# Patient Record
Sex: Female | Born: 1935 | Race: White | Hispanic: No | Smoking: Never smoker
Health system: Southern US, Community
[De-identification: ages and names within clinical notes are randomized; demographics above are authoritative.]

## PROBLEM LIST (undated history)

## (undated) DIAGNOSIS — I10 Essential (primary) hypertension: Secondary | ICD-10-CM

## (undated) DIAGNOSIS — C801 Malignant (primary) neoplasm, unspecified: Secondary | ICD-10-CM

## (undated) DIAGNOSIS — K219 Gastro-esophageal reflux disease without esophagitis: Secondary | ICD-10-CM

## (undated) DIAGNOSIS — M810 Age-related osteoporosis without current pathological fracture: Secondary | ICD-10-CM

## (undated) DIAGNOSIS — E785 Hyperlipidemia, unspecified: Secondary | ICD-10-CM

## (undated) HISTORY — DX: Malignant (primary) neoplasm, unspecified: C80.1

## (undated) HISTORY — DX: Essential (primary) hypertension: I10

## (undated) HISTORY — DX: Gastro-esophageal reflux disease without esophagitis: K21.9

## (undated) HISTORY — DX: Age-related osteoporosis without current pathological fracture: M81.0

## (undated) HISTORY — DX: Hyperlipidemia, unspecified: E78.5

---

## 1967-08-08 HISTORY — PX: ABDOMINAL HYSTERECTOMY: SHX81

## 1979-08-08 DIAGNOSIS — C801 Malignant (primary) neoplasm, unspecified: Secondary | ICD-10-CM

## 1979-08-08 HISTORY — DX: Malignant (primary) neoplasm, unspecified: C80.1

## 1979-08-08 HISTORY — PX: BREAST SURGERY: SHX581

## 1979-08-08 HISTORY — PX: MASTECTOMY: SHX3

## 2004-08-07 HISTORY — PX: TUMOR REMOVAL: SHX12

## 2004-12-08 ENCOUNTER — Ambulatory Visit: Payer: Self-pay | Admitting: Family Medicine

## 2004-12-09 ENCOUNTER — Encounter: Admission: RE | Admit: 2004-12-09 | Discharge: 2004-12-09 | Payer: Self-pay | Admitting: Orthopaedic Surgery

## 2005-03-29 ENCOUNTER — Ambulatory Visit: Payer: Self-pay | Admitting: Family Medicine

## 2005-12-27 ENCOUNTER — Ambulatory Visit: Payer: Self-pay | Admitting: Ophthalmology

## 2006-01-03 ENCOUNTER — Ambulatory Visit: Payer: Self-pay | Admitting: Ophthalmology

## 2006-02-16 ENCOUNTER — Ambulatory Visit: Payer: Self-pay | Admitting: Gastroenterology

## 2006-04-02 ENCOUNTER — Ambulatory Visit: Payer: Self-pay | Admitting: Family Medicine

## 2007-04-05 ENCOUNTER — Ambulatory Visit: Payer: Self-pay | Admitting: Family Medicine

## 2008-04-06 ENCOUNTER — Ambulatory Visit: Payer: Self-pay | Admitting: Family Medicine

## 2009-04-08 ENCOUNTER — Ambulatory Visit: Payer: Self-pay | Admitting: Family Medicine

## 2010-04-18 ENCOUNTER — Ambulatory Visit: Payer: Self-pay | Admitting: Family Medicine

## 2011-05-01 ENCOUNTER — Ambulatory Visit: Payer: Self-pay | Admitting: Family Medicine

## 2012-01-16 ENCOUNTER — Ambulatory Visit (INDEPENDENT_AMBULATORY_CARE_PROVIDER_SITE_OTHER): Payer: Medicare Other | Admitting: Internal Medicine

## 2012-01-16 ENCOUNTER — Encounter: Payer: Self-pay | Admitting: Internal Medicine

## 2012-01-16 VITALS — BP 156/64 | HR 57 | Temp 98.0°F | Resp 16 | Ht 64.0 in | Wt 151.2 lb

## 2012-01-16 DIAGNOSIS — E1121 Type 2 diabetes mellitus with diabetic nephropathy: Secondary | ICD-10-CM | POA: Insufficient documentation

## 2012-01-16 DIAGNOSIS — I10 Essential (primary) hypertension: Secondary | ICD-10-CM

## 2012-01-16 DIAGNOSIS — E039 Hypothyroidism, unspecified: Secondary | ICD-10-CM

## 2012-01-16 DIAGNOSIS — E119 Type 2 diabetes mellitus without complications: Secondary | ICD-10-CM

## 2012-01-16 DIAGNOSIS — E785 Hyperlipidemia, unspecified: Secondary | ICD-10-CM | POA: Insufficient documentation

## 2012-01-16 DIAGNOSIS — M7661 Achilles tendinitis, right leg: Secondary | ICD-10-CM

## 2012-01-16 DIAGNOSIS — M810 Age-related osteoporosis without current pathological fracture: Secondary | ICD-10-CM | POA: Insufficient documentation

## 2012-01-16 DIAGNOSIS — M7660 Achilles tendinitis, unspecified leg: Secondary | ICD-10-CM

## 2012-01-16 DIAGNOSIS — K219 Gastro-esophageal reflux disease without esophagitis: Secondary | ICD-10-CM | POA: Insufficient documentation

## 2012-01-16 MED ORDER — OMEPRAZOLE 20 MG PO CPDR: 20.0000 mg | DELAYED_RELEASE_CAPSULE | Freq: Every day | ORAL | Status: DC

## 2012-01-16 MED ORDER — IBANDRONATE SODIUM 150 MG PO TABS: 150.0000 mg | ORAL_TABLET | ORAL | Status: DC

## 2012-01-16 NOTE — Assessment & Plan Note (Signed)
She is due for semi annual tsh

## 2012-01-16 NOTE — Progress Notes (Addendum)
Patient ID: Pamela Hanna, female   DOB: [birth year], 78 y.o.   MRN: [id number]  Patient Active Problem List  Diagnoses  . Hypertension  . Hyperlipidemia  . Type II diabetes mellitus, well controlled  . Osteoporosis, post-menopausal  . GERD (gastroesophageal reflux disease)  . Achilles tendinitis of right lower extremity  . Unspecified hypothyroidism    Subjective:  CC:   Chief Complaint  Patient presents with  . New Patient    HPI:   Pamela Hanna a 78 y.o. female who presents as a new patient to establish rimary care. Her first cc  Is heel pain started in calf since Sunday after walking for longer period of time than usually done while visiting with daughter in [place]. No improvement thus far with increase in use of advil to 4 daily . No history of PAD.  No claudication symptoms.  Has history of nocturnal leg cramps treated with quinine tablets (available in a bottle marked Leg Cramps ith "quinine" written in  spanish)  at [facility]. 2nd issue is stomach upset.  has been bothering her today , had a second (uncharacteristic) slightly loose stool today after eating a new  cabbage dish twice in the last 24 hours. Sleeping fine,  Woken up at 3 am by her cat., but able to seep  A few more hours,  Lives alone. Does her own shopping, housecleaning and small odd jobs  But her  yardwork done by a paid Financial controller.  Has osteoporosis,  Has been on Boniva for close to 8 years .  3rd new issue is occurrence of dyspepsia occurring after eating.  Has occurred daily for the lst week or so.  No nausea or unintentional wt loss.  No dark stool, no hiccups.       Past Medical History  Diagnosis Date  . Breast Cancer 1981    ledt mastectomy  . Hyperlipidemia   . Diabetes mellitus   . GERD (gastroesophageal reflux disease)   . Osteoporosis, post-menopausal     managedwith bisphosphonates since 2005 with no change  . Hypertension     Past Surgical History  Procedure Date  . Abdominal hysterectomy 1969   menorrhagia  . Cesarean section   . Tumor removal 2006    thoracic  back , benign  . Breast surgery 1981    left radical mastectomy with implant         The following portions of the patient's history were reviewed and updated as appropriate: Allergies, current medications, and problem list.    Review of Systems:   12 Pt  review of systems was negative except those addressed in the HPI,     History   Social History  . Marital Status: Unknown    Spouse Name: N/A    Number of Children: N/A  . Years of Education: N/A   Occupational History  . Not on file.   Social History Main Topics  . Smoking status: Never Smoker   . Smokeless tobacco: Never Used  . Alcohol Use: No  . Drug Use: No  . Sexually Active: Not on file   Other Topics Concern  . Not on file   Social History Narrative  . No narrative on file    Objective:  BP 156/64  Pulse 57  Temp(Src) 98 F (36.7 C) (Oral)  Resp 16  Ht 5\' 4"  (1.626 m)  Wt 151 lb 4 oz (68.607 kg)  BMI 25.96 kg/m2  SpO2 97%  General appearance: alert, cooperative and appears  stated age Ears: normal TM's and external ear canals both ears Throat: lips, mucosa, and tongue normal; teeth and gums normal Neck: no adenopathy, no carotid bruit, supple, symmetrical, trachea midline and thyroid not enlarged, symmetric, no tenderness/mass/nodules Back: symmetric, no curvature. ROM normal. No CVA tenderness. Lungs: clear to auscultation bilaterally Heart: regular rate and rhythm, S1, S2 normal, no murmur, click, rub or gallop Abdomen: soft, non-tender; bowel sounds normal; no masses,  no organomegaly Pulses: 2+ and symmetric Skin: Skin color, texture, turgor normal. No rashes or lesions Lymph nodes: Cervical, supraclavicular, and axillary nodes normal. Neuro: grossly nonfocal except fro loss of DTRS from knee distally bilaterally/ Foot exam:  Nails are well trimmed,  No callouses,  Sensation intact to microfilament   Assessment  and Plan:  GERD (gastroesophageal reflux disease) New problem, precipitated by eating.  No dysphagia, weight loss,  Trial of omeprazole   Hypertension 'white coat hypertension' with higher in-office readings than home readings.  Will have her check it at [facility] and report readings to me prior to making chagnes.   Type II diabetes mellitus, well controlled Last A1c was 7.1 in [place].  Up to date with eye exam,.. Foot exam was normal today except for lack of reflexes.  Repeat labs due. No medication changes today.   Achilles tendinitis of right lower extremity Supportive care recommended. If no improvement with NSAIDs, ice/heat and stretching, orthopedics referral   Unspecified hypothyroidism She is due for semi annual tsh    Updated Medication List Outpatient Encounter Prescriptions as of 01/16/2012  Medication Sig Dispense Refill  . aspirin 81 MG tablet Take 81 mg by mouth daily.      . Calcium Carbonate-Vitamin D (CALCIUM + D) 600-200 MG-UNIT TABS Take by mouth.      . diltiazem (DILACOR XR) 240 MG 24 hr capsule Take 240 mg by mouth daily.      . fish oil-omega-3 fatty acids 1000 MG capsule Take 2 g by mouth daily.      Marland Kitchen glimepiride (AMARYL) 2 MG tablet Take 2 mg by mouth daily before breakfast.      . ibandronate (BONIVA) 150 MG tablet Take 1 tablet (150 mg total) by mouth every 30 (thirty) days. Take in the morning with a full glass of water, on an empty stomach, and do not take anything else by mouth or lie down for the next 30 min.  4 tablet  3  . levothyroxine (SYNTHROID, LEVOTHROID) 75 MCG tablet Take 75 mcg by mouth daily.      . metFORMIN (GLUCOPHAGE) 500 MG tablet Take 500 mg by mouth 2 (two) times daily with a meal.      . omeprazole (PRILOSEC) 20 MG capsule Take 1 capsule (20 mg total) by mouth daily.  30 capsule  3  . potassium chloride (K-DUR) 10 MEQ tablet Take 10 mEq by mouth daily.      . pravastatin (PRAVACHOL) 40 MG tablet Take 40 mg by mouth daily.      .  ranitidine (ZANTAC) 75 MG tablet Take 75 mg by mouth 2 (two) times daily.      . valsartan-hydrochlorothiazide (DIOVAN-HCT) 320-25 MG per tablet Take 1 tablet by mouth daily.      Marland Kitchen DISCONTD: ibandronate (BONIVA) 150 MG tablet Take 150 mg by mouth every 30 (thirty) days. Take in the morning with a full glass of water, on an empty stomach, and do not take anything else by mouth or lie down for the next 30 min.  Orders Placed This Encounter  Procedures  . [facility] MAMMOGRAPHY  . Microalbumin / creatinine urine ratio  . Comprehensive metabolic panel  . Lipid panel  . Hemoglobin A1c  . TSH    No Follow-up on file.

## 2012-01-16 NOTE — Progress Notes (Signed)
Addended by: Duncan Dull on: 01/16/2012 03:36 PM   Modules accepted: Orders

## 2012-01-16 NOTE — Assessment & Plan Note (Signed)
Last A1c was 7.1 in [place].  Up to date with eye exam,.. Foot exam was normal today except for lack of reflexes.  Repeat labs due. No medication changes today.

## 2012-01-16 NOTE — Assessment & Plan Note (Signed)
New problem, precipitated by eating.  No dysphagia, weight loss,  Trial of omeprazole

## 2012-01-16 NOTE — Assessment & Plan Note (Signed)
'  white coat hypertension' with higher in-office readings than home readings.  Will have her check it at [facility] and report readings to me prior to making chagnes.

## 2012-01-16 NOTE — Assessment & Plan Note (Signed)
Supportive care recommended. If no improvement with NSAIDs, ice/heat and stretching, orthopedics referral

## 2012-01-16 NOTE — Patient Instructions (Addendum)
You can take 2 or 3 advil  every 8 hours along with 1 tylenol every 8 hours for your achilles tendonitis  Ice pack , wrap around ankle 15 minutes  Per session,  2 or times daily .  Alternate with very warm water .  Try omeprazole (OTC prilosec) 20 mg daily to protect stomach upset and prevent reflux

## 2012-01-16 NOTE — Progress Notes (Signed)
  Patient ID: Pamela Hanna, female   DOB: 02-23-1925, 78 y.o.   MRN: [id number]  Heel pain started in calf since Sunday after walking a good but around [facility] with daughter the day prior . No improvement thus far. No history of PAD<  No claudication symptoms.  Nocturnal leg cramps treated with quinine tablets (available in spanish)  at [facility].  Stomach has been bothering her today , with a loose stool today after eating cabbage twice in 24 hours. Also has increased her advil to 4 daily since Sunday . Sleeping fine,  Woken up at 3 am by her cat., but able to seep  A few more hours,  Lives alone. Does her own shopping, housecleaning and small odd jobs  But her  yardwork done by a paid Financial controller.  Has osteoporosis,  Has been on Boniva for close to 5 years .  Gets reflux after eating

## 2012-01-23 ENCOUNTER — Other Ambulatory Visit (INDEPENDENT_AMBULATORY_CARE_PROVIDER_SITE_OTHER): Payer: Medicare Other | Admitting: *Deleted

## 2012-01-23 DIAGNOSIS — E039 Hypothyroidism, unspecified: Secondary | ICD-10-CM

## 2012-01-23 DIAGNOSIS — E119 Type 2 diabetes mellitus without complications: Secondary | ICD-10-CM

## 2012-01-23 LAB — TSH: TSH: 3.11 u[IU]/mL (ref 0.35–5.50)

## 2012-01-23 LAB — COMPREHENSIVE METABOLIC PANEL
Albumin: 4.2 g/dL (ref 3.5–5.2)
BUN: 29 mg/dL — ABNORMAL HIGH (ref 6–23)
CO2: 25 mEq/L (ref 19–32)
Chloride: 107 mEq/L (ref 96–112)
Creatinine, Ser: 1.3 mg/dL — ABNORMAL HIGH (ref 0.4–1.2)
Glucose, Bld: 139 mg/dL — ABNORMAL HIGH (ref 70–99)
Total Bilirubin: 0.7 mg/dL (ref 0.3–1.2)

## 2012-01-23 LAB — LIPID PANEL
Total CHOL/HDL Ratio: 5
Triglycerides: 185 mg/dL — ABNORMAL HIGH (ref 0.0–149.0)

## 2012-01-23 LAB — MICROALBUMIN / CREATININE URINE RATIO: Microalb Creat Ratio: 1.3 mg/g (ref 0.0–30.0)

## 2012-01-23 LAB — HEMOGLOBIN A1C: Hgb A1c MFr Bld: 7.3 % — ABNORMAL HIGH (ref 4.6–6.5)

## 2012-02-06 ENCOUNTER — Telehealth: Payer: Self-pay | Admitting: Internal Medicine

## 2012-02-06 MED ORDER — OMEPRAZOLE 20 MG PO CPDR: 20.0000 mg | DELAYED_RELEASE_CAPSULE | Freq: Every day | ORAL | Status: DC

## 2012-02-06 NOTE — Telephone Encounter (Signed)
Patient is wanting a 90 supply of Omeprazole 20 mg.

## 2012-02-06 NOTE — Telephone Encounter (Signed)
Yes, stop it .  chart updated

## 2012-02-06 NOTE — Telephone Encounter (Signed)
Patient called and stated you wanted to know a medication she is taking, its Boniva.  She stated she has been on it since 12/2003 and wanted to know if she needed to stop it.  Please advise.

## 2012-02-07 ENCOUNTER — Other Ambulatory Visit: Payer: Self-pay | Admitting: Internal Medicine

## 2012-02-07 DIAGNOSIS — R7989 Other specified abnormal findings of blood chemistry: Secondary | ICD-10-CM

## 2012-02-07 NOTE — Telephone Encounter (Signed)
Patient notified

## 2012-02-23 ENCOUNTER — Other Ambulatory Visit: Payer: Medicare Other

## 2012-02-23 ENCOUNTER — Other Ambulatory Visit (INDEPENDENT_AMBULATORY_CARE_PROVIDER_SITE_OTHER): Payer: Medicare Other | Admitting: *Deleted

## 2012-02-23 DIAGNOSIS — Z79899 Other long term (current) drug therapy: Secondary | ICD-10-CM

## 2012-02-23 LAB — HEPATIC FUNCTION PANEL
AST: 49 U/L — ABNORMAL HIGH (ref 0–37)
Alkaline Phosphatase: 61 U/L (ref 39–117)
Total Bilirubin: 0.7 mg/dL (ref 0.3–1.2)

## 2012-02-26 ENCOUNTER — Other Ambulatory Visit: Payer: Self-pay | Admitting: *Deleted

## 2012-02-26 MED ORDER — METFORMIN HCL 500 MG PO TABS: 500.0000 mg | ORAL_TABLET | Freq: Two times a day (BID) | ORAL | Status: DC

## 2012-03-20 ENCOUNTER — Telehealth: Payer: Self-pay | Admitting: Internal Medicine

## 2012-03-20 MED ORDER — POTASSIUM CHLORIDE ER 10 MEQ PO TBCR: 10.0000 meq | EXTENDED_RELEASE_TABLET | Freq: Every day | ORAL | Status: DC

## 2012-03-20 NOTE — Telephone Encounter (Signed)
Patient needs a refill called into [facility] pharmacy for her potassium 10 mg, she would like a 3 month supply called in.

## 2012-03-25 ENCOUNTER — Other Ambulatory Visit: Payer: Self-pay | Admitting: Internal Medicine

## 2012-03-25 MED ORDER — DILTIAZEM HCL ER 240 MG PO CP24: 240.0000 mg | ORAL_CAPSULE | Freq: Every day | ORAL | Status: DC

## 2012-03-25 NOTE — Telephone Encounter (Signed)
Refill on Diltiazem XR 240 mg 3 month supply.

## 2012-04-01 ENCOUNTER — Telehealth: Payer: Self-pay | Admitting: Internal Medicine

## 2012-04-01 MED ORDER — METFORMIN HCL 500 MG PO TABS: 500.0000 mg | ORAL_TABLET | Freq: Two times a day (BID) | ORAL | Status: DC

## 2012-04-01 NOTE — Telephone Encounter (Signed)
Patient called in would like her metformin called into [facility] pharmacy for a 3 month supply.  She does have enough for the rest of this week. I advised her to call pharmacy from now on and she said she would but since she was asking for a 3 month supply she thought she should call here.

## 2012-04-03 ENCOUNTER — Other Ambulatory Visit: Payer: Self-pay | Admitting: *Deleted

## 2012-04-03 MED ORDER — GLIMEPIRIDE 2 MG PO TABS: 2.0000 mg | ORAL_TABLET | Freq: Every day | ORAL | Status: DC

## 2012-04-03 MED ORDER — PRAVASTATIN SODIUM 40 MG PO TABS: 40.0000 mg | ORAL_TABLET | Freq: Every day | ORAL | Status: DC

## 2012-04-03 MED ORDER — LEVOTHYROXINE SODIUM 75 MCG PO TABS: 75.0000 ug | ORAL_TABLET | Freq: Every day | ORAL | Status: DC

## 2012-04-05 ENCOUNTER — Other Ambulatory Visit: Payer: Self-pay | Admitting: Internal Medicine

## 2012-04-05 MED ORDER — GLIMEPIRIDE 2 MG PO TABS: 2.0000 mg | ORAL_TABLET | Freq: Two times a day (BID) | ORAL | Status: DC

## 2012-04-24 ENCOUNTER — Other Ambulatory Visit: Payer: Self-pay | Admitting: Internal Medicine

## 2012-04-24 MED ORDER — DILTIAZEM HCL ER 240 MG PO CP24: 240.0000 mg | ORAL_CAPSULE | Freq: Every day | ORAL | Status: DC

## 2012-04-29 ENCOUNTER — Encounter: Payer: Self-pay | Admitting: Internal Medicine

## 2012-04-29 ENCOUNTER — Telehealth: Payer: Self-pay | Admitting: Internal Medicine

## 2012-04-29 ENCOUNTER — Ambulatory Visit (INDEPENDENT_AMBULATORY_CARE_PROVIDER_SITE_OTHER): Payer: Medicare Other | Admitting: Internal Medicine

## 2012-04-29 VITALS — BP 134/60 | HR 60 | Temp 97.9°F | Wt 152.8 lb

## 2012-04-29 DIAGNOSIS — K759 Inflammatory liver disease, unspecified: Secondary | ICD-10-CM

## 2012-04-29 DIAGNOSIS — E785 Hyperlipidemia, unspecified: Secondary | ICD-10-CM

## 2012-04-29 DIAGNOSIS — K219 Gastro-esophageal reflux disease without esophagitis: Secondary | ICD-10-CM

## 2012-04-29 DIAGNOSIS — Z853 Personal history of malignant neoplasm of breast: Secondary | ICD-10-CM | POA: Insufficient documentation

## 2012-04-29 DIAGNOSIS — R7989 Other specified abnormal findings of blood chemistry: Secondary | ICD-10-CM

## 2012-04-29 DIAGNOSIS — E119 Type 2 diabetes mellitus without complications: Secondary | ICD-10-CM

## 2012-04-29 DIAGNOSIS — Z1239 Encounter for other screening for malignant neoplasm of breast: Secondary | ICD-10-CM

## 2012-04-29 DIAGNOSIS — R748 Abnormal levels of other serum enzymes: Secondary | ICD-10-CM

## 2012-04-29 LAB — HEMOGLOBIN A1C: Hgb A1c MFr Bld: 7.4 % — ABNORMAL HIGH (ref 4.6–6.5)

## 2012-04-29 LAB — LDL CHOLESTEROL, DIRECT: Direct LDL: 159.6 mg/dL

## 2012-04-29 MED ORDER — VALSARTAN-HYDROCHLOROTHIAZIDE 320-25 MG PO TABS: 1.0000 | ORAL_TABLET | Freq: Every day | ORAL | Status: DC

## 2012-04-29 NOTE — Telephone Encounter (Signed)
Refilled at [facility] today

## 2012-04-29 NOTE — Telephone Encounter (Signed)
Valsartan/hctz 320-5 mg tab #90 Take one tablet by mouth every day

## 2012-04-29 NOTE — Progress Notes (Signed)
Patient ID: Pamela Hanna Hanna, female   DOB: 15-Jun-1925, 78 y.o.   MRN: [id number]  Patient Active Problem List  Diagnosis  . Hypertension  . Hyperlipidemia  . Type II diabetes mellitus, well controlled  . Osteoporosis, post-menopausal  . GERD (gastroesophageal reflux disease)  . Achilles tendinitis of right lower extremity  . Unspecified hypothyroidism  . History of breast cancer    Subjective:  CC:   Chief Complaint  Patient presents with  . Follow-up    Needs mammo referral    HPI:   Pamela Hanna Hanna a 78 y.o. female who presents for follow up on chronic conditions including diabetes mellitus, hyperlipidemia, hypertension and history of breast cancer. Pamela Hanna feels generally well.  Her blood sugars have been under 160 with rare exceptions.  Pamela Hanna denies any hypoglycemic events.  Pamela Hanna has had no changes in bowel habits, no trouble sleeping,  No numbness or parasthesias.  Her foot pain finally resolved with rest, NSAIDs and avoidance of barefoot.    Past Medical History  Diagnosis Date  . Breast Cancer 1981    ledt mastectomy  . Hyperlipidemia   . Diabetes mellitus   . GERD (gastroesophageal reflux disease)   . Osteoporosis, post-menopausal     managedwith bisphosphonates since 2005 with no change  . Hypertension     Past Surgical History  Procedure Date  . Abdominal hysterectomy 1969    menorrhagia  . Cesarean section   . Tumor removal 2006    thoracic  back , benign  . Breast surgery 1981    left radical mastectomy with implant         The following portions of the patient's history were reviewed and updated as appropriate: Allergies, current medications, and problem list.    Review of Systems:   12 Pt  review of systems was negative except those addressed in the HPI,     History   Social History  . Marital Status: Unknown    Spouse Name: N/A    Number of Children: N/A  . Years of Education: N/A   Occupational History  . Not on file.   Social History Main  Topics  . Smoking status: Never Smoker   . Smokeless tobacco: Never Used  . Alcohol Use: No  . Drug Use: No  . Sexually Active: Not on file   Other Topics Concern  . Not on file   Social History Narrative  . No narrative on file    Objective:  BP 134/60  Pulse 60  Temp 97.9 F (36.6 C) (Oral)  Wt 152 lb 12 oz (69.287 kg)  General appearance: alert, cooperative and appears stated age Ears: normal TM's and external ear canals both ears Throat: lips, mucosa, and tongue normal; teeth and gums normal Neck: no adenopathy, no carotid bruit, supple, symmetrical, trachea midline and thyroid not enlarged, symmetric, no tenderness/mass/nodules Back: symmetric, no curvature. ROM normal. No CVA tenderness. Lungs: clear to auscultation bilaterally Heart: regular rate and rhythm, S1, S2 normal, no murmur, click, rub or gallop Abdomen: soft, non-tender; bowel sounds normal; no masses,  no organomegaly Pulses: 2+ and symmetric Skin: Skin color, texture, turgor normal. No rashes or lesions Lymph nodes: Cervical, supraclavicular, and axillary nodes normal.  Assessment and Plan:  GERD (gastroesophageal reflux disease) Managed with prilosec.    History of breast cancer Pamela Hanna is due for diagnostic mammogram of right breast.  Type II diabetes mellitus, well controlled Mostly well controlled.  A1c due,  Diabetic foot exam done.  Pamela Hanna has  decreased sensation over several areas.   Hyperlipidemia Pravastatin started, for LDL goal of 70. Repeat lpids are due.    Updated Medication List Outpatient Encounter Prescriptions as of 04/29/2012  Medication Sig Dispense Refill  . aspirin 81 MG tablet Take 81 mg by mouth daily.      . Calcium Carbonate-Vitamin D (CALCIUM + D) 600-200 MG-UNIT TABS Take by mouth.      . diltiazem (DILACOR XR) 240 MG 24 hr capsule Take 1 capsule (240 mg total) by mouth daily.  90 capsule  3  . fish oil-omega-3 fatty acids 1000 MG capsule Take 2 g by mouth daily.      Marland Kitchen  glimepiride (AMARYL) 2 MG tablet Take 1 tablet (2 mg total) by mouth 2 (two) times daily.  180 tablet  1  . levothyroxine (SYNTHROID, LEVOTHROID) 75 MCG tablet Take 1 tablet (75 mcg total) by mouth daily.  90 tablet  2  . metFORMIN (GLUCOPHAGE) 500 MG tablet Take 1 tablet (500 mg total) by mouth 2 (two) times daily with a meal.  180 tablet  3  . omeprazole (PRILOSEC) 20 MG capsule Take 1 capsule (20 mg total) by mouth daily.  90 capsule  3  . potassium chloride (K-DUR) 10 MEQ tablet Take 1 tablet (10 mEq total) by mouth daily.  90 tablet  3  . pravastatin (PRAVACHOL) 40 MG tablet Take 1 tablet (40 mg total) by mouth daily.  90 tablet  1  . ranitidine (ZANTAC) 75 MG tablet Take 75 mg by mouth 2 (two) times daily.      . valsartan-hydrochlorothiazide (DIOVAN-HCT) 320-25 MG per tablet Take 1 tablet by mouth daily.  90 tablet  3  . DISCONTD: valsartan-hydrochlorothiazide (DIOVAN-HCT) 320-25 MG per tablet Take 1 tablet by mouth daily.         Orders Placed This Encounter  Procedures  . MM Digital Screening  . Comprehensive metabolic panel  . Hemoglobin A1c  . LDL cholesterol, direct    Return in about 3 months (around 07/29/2012).

## 2012-04-29 NOTE — Assessment & Plan Note (Signed)
Managed with prilosec  

## 2012-04-29 NOTE — Assessment & Plan Note (Signed)
Pravastatin started, for LDL goal of 70. Repeat lpids are due.

## 2012-04-29 NOTE — Patient Instructions (Addendum)
Ask about your pneumonia and flu shot .  It is ok to repeat the pneumonia vaccine one more time if it has been 5 years.   Ask about the TdaP vaccine,  It is needed to protect against pertussis which is on the rise again (it also has the  tetanus 10 yr booster)  It will cost you $110.00 ;  Ask your pharmacist about his price.   We are repeating your bloodwork today

## 2012-04-29 NOTE — Assessment & Plan Note (Signed)
Mostly well controlled.  A1c due,  Diabetic foot exam done.  She has decreased sensation over several areas.

## 2012-04-29 NOTE — Assessment & Plan Note (Addendum)
She is due for diagnostic mammogram of right breast.

## 2012-04-30 LAB — COMPREHENSIVE METABOLIC PANEL
ALT: 51 U/L — ABNORMAL HIGH (ref 0–35)
Albumin: 4.5 g/dL (ref 3.5–5.2)
BUN: 35 mg/dL — ABNORMAL HIGH (ref 6–23)
CO2: 22 mEq/L (ref 19–32)
Calcium: 10.8 mg/dL — ABNORMAL HIGH (ref 8.4–10.5)
GFR: 43.23 mL/min — ABNORMAL LOW (ref >60.00)
Sodium: 146 mEq/L — ABNORMAL HIGH (ref 135–145)
Total Protein: 7.8 g/dL (ref 6.0–8.3)

## 2012-04-30 NOTE — Addendum Note (Signed)
Addended by: Duncan Dull on: 04/30/2012 05:28 PM   Modules accepted: Orders

## 2012-05-01 ENCOUNTER — Other Ambulatory Visit: Payer: Self-pay | Admitting: Internal Medicine

## 2012-05-01 MED ORDER — TETANUS-DIPHTH-ACELL PERTUSSIS 5-2.5-18.5 LF-MCG/0.5 IM SUSP: 0.5000 mL | Freq: Once | INTRAMUSCULAR | Status: DC

## 2012-05-01 MED ORDER — PNEUMOCOCCAL VAC POLYVALENT 25 MCG/0.5ML IJ INJ: 0.5000 mL | INJECTION | Freq: Once | INTRAMUSCULAR | Status: DC

## 2012-05-02 ENCOUNTER — Ambulatory Visit: Payer: Self-pay | Admitting: Internal Medicine

## 2012-05-02 ENCOUNTER — Telehealth: Payer: Self-pay | Admitting: Internal Medicine

## 2012-05-02 DIAGNOSIS — R748 Abnormal levels of other serum enzymes: Secondary | ICD-10-CM

## 2012-05-02 NOTE — Telephone Encounter (Signed)
abd ultrasound showed some fatty changes of the liver and sludge within the gallbladder. I would like to make sure that her gallbladder is emptying correctly by ordering an emptying study (HIDA scan) .  If this is normal , we can continue to suspend the pravastatin for another 2 months and repeat her liver tests then. Please call back if she does not hear from [place] by Monday about this appointment

## 2012-05-02 NOTE — Telephone Encounter (Signed)
Patient notified and verbalizes understanding.

## 2012-05-06 ENCOUNTER — Encounter: Payer: Self-pay | Admitting: Internal Medicine

## 2012-05-07 ENCOUNTER — Telehealth: Payer: Self-pay | Admitting: Internal Medicine

## 2012-05-07 ENCOUNTER — Ambulatory Visit: Payer: Self-pay | Admitting: Internal Medicine

## 2012-05-07 DIAGNOSIS — E785 Hyperlipidemia, unspecified: Secondary | ICD-10-CM

## 2012-05-07 DIAGNOSIS — R7989 Other specified abnormal findings of blood chemistry: Secondary | ICD-10-CM

## 2012-05-07 NOTE — Telephone Encounter (Signed)
her gallbladder test was normal. I would like her to continue to suspend her cholesterol medication and return for fasting lipids and a cmet in 2 months.

## 2012-05-08 NOTE — Telephone Encounter (Signed)
Patient notified of lab results

## 2012-05-08 NOTE — Telephone Encounter (Signed)
Pt called checking on her gallbladder test

## 2012-05-14 ENCOUNTER — Telehealth: Payer: Self-pay | Admitting: Internal Medicine

## 2012-05-14 ENCOUNTER — Encounter: Payer: Self-pay | Admitting: Internal Medicine

## 2012-05-14 DIAGNOSIS — Z853 Personal history of malignant neoplasm of breast: Secondary | ICD-10-CM

## 2012-05-14 NOTE — Telephone Encounter (Signed)
Order entered into epic

## 2012-05-14 NOTE — Telephone Encounter (Signed)
Pt is scheduled tomorrow at [facility] for Uni Lateral Mammo but the order needs to say Uni Lateral Diagnostic Mammo. Pt has history of Breast Cancer.

## 2012-05-15 ENCOUNTER — Ambulatory Visit: Payer: Self-pay | Admitting: Internal Medicine

## 2012-05-27 ENCOUNTER — Encounter: Payer: Self-pay | Admitting: Internal Medicine

## 2012-06-06 ENCOUNTER — Encounter: Payer: Self-pay | Admitting: Internal Medicine

## 2012-06-24 ENCOUNTER — Other Ambulatory Visit (INDEPENDENT_AMBULATORY_CARE_PROVIDER_SITE_OTHER): Payer: Medicare Other

## 2012-06-24 DIAGNOSIS — R7989 Other specified abnormal findings of blood chemistry: Secondary | ICD-10-CM

## 2012-06-24 DIAGNOSIS — E785 Hyperlipidemia, unspecified: Secondary | ICD-10-CM

## 2012-06-24 DIAGNOSIS — K760 Fatty (change of) liver, not elsewhere classified: Secondary | ICD-10-CM

## 2012-06-24 LAB — COMPREHENSIVE METABOLIC PANEL
AST: 98 U/L — ABNORMAL HIGH (ref 0–37)
Albumin: 4.3 g/dL (ref 3.5–5.2)
Alkaline Phosphatase: 73 U/L (ref 39–117)
BUN: 25 mg/dL — ABNORMAL HIGH (ref 6–23)
Calcium: 9.8 mg/dL (ref 8.4–10.5)
Chloride: 105 mEq/L (ref 96–112)
Glucose, Bld: 154 mg/dL — ABNORMAL HIGH (ref 70–99)
Potassium: 4.5 mEq/L (ref 3.5–5.1)
Sodium: 139 mEq/L (ref 135–145)
Total Protein: 7.5 g/dL (ref 6.0–8.3)

## 2012-06-24 LAB — LIPID PANEL
Cholesterol: 337 mg/dL — ABNORMAL HIGH (ref 0–200)
HDL: 35.6 mg/dL — ABNORMAL LOW (ref >39.00)
Triglycerides: 234 mg/dL — ABNORMAL HIGH (ref 0.0–149.0)
VLDL: 46.8 mg/dL — ABNORMAL HIGH (ref 0.0–40.0)

## 2012-06-26 NOTE — Addendum Note (Signed)
Addended by: Sherlene Shams on: 06/26/2012 06:06 PM   Modules accepted: Orders

## 2012-07-17 ENCOUNTER — Ambulatory Visit: Payer: Medicare Other | Admitting: Internal Medicine

## 2012-08-27 ENCOUNTER — Encounter: Payer: Self-pay | Admitting: Internal Medicine

## 2012-08-27 ENCOUNTER — Ambulatory Visit (INDEPENDENT_AMBULATORY_CARE_PROVIDER_SITE_OTHER): Payer: Medicare Other | Admitting: Internal Medicine

## 2012-08-27 VITALS — BP 138/60 | HR 57 | Temp 97.7°F | Resp 16 | Wt 157.2 lb

## 2012-08-27 DIAGNOSIS — E119 Type 2 diabetes mellitus without complications: Secondary | ICD-10-CM

## 2012-08-27 DIAGNOSIS — K760 Fatty (change of) liver, not elsewhere classified: Secondary | ICD-10-CM

## 2012-08-27 DIAGNOSIS — K7689 Other specified diseases of liver: Secondary | ICD-10-CM

## 2012-08-27 DIAGNOSIS — E039 Hypothyroidism, unspecified: Secondary | ICD-10-CM

## 2012-08-27 DIAGNOSIS — M810 Age-related osteoporosis without current pathological fracture: Secondary | ICD-10-CM

## 2012-08-27 DIAGNOSIS — E785 Hyperlipidemia, unspecified: Secondary | ICD-10-CM

## 2012-08-27 DIAGNOSIS — I10 Essential (primary) hypertension: Secondary | ICD-10-CM

## 2012-08-27 LAB — LIPID PANEL
Cholesterol: 218 mg/dL — ABNORMAL HIGH (ref 0–200)
HDL: 35.3 mg/dL — ABNORMAL LOW (ref >39.00)
Total CHOL/HDL Ratio: 6
Triglycerides: 216 mg/dL — ABNORMAL HIGH (ref 0.0–149.0)

## 2012-08-27 LAB — HEPATIC FUNCTION PANEL
ALT: 67 U/L — ABNORMAL HIGH (ref 0–35)
AST: 77 U/L — ABNORMAL HIGH (ref 0–37)
Albumin: 4.4 g/dL (ref 3.5–5.2)

## 2012-08-27 LAB — LDL CHOLESTEROL, DIRECT: Direct LDL: 134.9 mg/dL

## 2012-08-27 LAB — HEMOGLOBIN A1C: Hgb A1c MFr Bld: 8 % — ABNORMAL HIGH (ref 4.6–6.5)

## 2012-08-27 NOTE — Patient Instructions (Addendum)
Thank you for filling out the survey  You are doing great1  I will call you with the lab results  Return for your annual Medicare exam in 3 months

## 2012-08-28 ENCOUNTER — Encounter: Payer: Self-pay | Admitting: Internal Medicine

## 2012-08-28 DIAGNOSIS — K760 Fatty (change of) liver, not elsewhere classified: Secondary | ICD-10-CM | POA: Insufficient documentation

## 2012-08-28 MED ORDER — SAXAGLIPTIN HCL 2.5 MG PO TABS: 2.5000 mg | ORAL_TABLET | Freq: Every day | ORAL | Status: DC

## 2012-08-28 MED ORDER — ATORVASTATIN CALCIUM 20 MG PO TABS: 40.0000 mg | ORAL_TABLET | Freq: Every day | ORAL | Status: DC

## 2012-08-28 NOTE — Assessment & Plan Note (Signed)
T scores stable, no prior fractures on bisphosphonate therapy

## 2012-08-28 NOTE — Progress Notes (Signed)
Patient ID: Pamela Hanna, female   DOB: [birth year], 79 y.o.   MRN: [id number]   Patient Active Problem List  Diagnosis  . Hypertension  . Hyperlipidemia  . Type II diabetes mellitus, well controlled  . Osteoporosis, post-menopausal  . GERD (gastroesophageal reflux disease)  . Unspecified hypothyroidism  . History of breast cancer    Subjective:  CC:   Chief Complaint  Patient presents with  . Follow-up    HPI:   Pamela Selfis a 79 y.o. female who presents 3 month follow up on diabetes , hypertension and hyperlipidemia.  She feels fine,  Has been taking her medications as directed, and walking regularly.  Just returned from a trip to [place]. No leg weakness, no low blood sugars.  Denies nausea, muscle pain. Tendonitis has resolved.   Past Medical History  Diagnosis Date  . Breast Cancer 1981    left mastectomy  . Diabetes mellitus   . GERD (gastroesophageal reflux disease)   . Osteoporosis, post-menopausal     managedwith bisphosphonates since 2005 with no change  . Hypertension   . Hyperlipidemia      Past Surgical History  Procedure Date  . Abdominal hysterectomy 1969    menorrhagia  . Cesarean section   . Tumor removal 2006    thoracic  back , benign  . Breast surgery 1981    left radical mastectomy with implant    The following portions of the patient's history were reviewed and updated as appropriate: Allergies, current medications, and problem list.    Review of Systems:   Patient denies headache, fevers, malaise, unintentional weight loss, skin rash, eye pain, sinus congestion and sinus pain, sore throat, dysphagia,  hemoptysis , cough, dyspnea, wheezing, chest pain, palpitations, orthopnea, edema, abdominal pain, nausea, melena, diarrhea, constipation, flank pain, dysuria, hematuria, urinary  Frequency, nocturia, numbness, tingling, seizures,  Focal weakness, Loss of consciousness,  Tremor, insomnia, depression, anxiety, and suicidal ideation.     History    Social History  . Marital Status: Unknown    Spouse Name: N/A    Number of Children: N/A  . Years of Education: N/A   Occupational History  . Not on file.   Social History Main Topics  . Smoking status: Never Smoker   . Smokeless tobacco: Never Used  . Alcohol Use: No  . Drug Use: No  . Sexually Active: Not on file   Other Topics Concern  . Not on file   Social History Narrative  . No narrative on file    Objective:  BP 138/60  Pulse 57  Temp 97.7 F (36.5 C) (Oral)  Resp 16  Wt 157 lb 4 oz (71.328 kg)  SpO2 97%  General appearance: alert, cooperative and appears stated age Ears: normal TM's and external ear canals both ears Throat: lips, mucosa, and tongue normal; teeth and gums normal Neck: no adenopathy, no carotid bruit, supple, symmetrical, trachea midline and thyroid not enlarged, symmetric, no tenderness/mass/nodules Back: symmetric, no curvature. ROM normal. No CVA tenderness. Lungs: clear to auscultation bilaterally Heart: regular rate and rhythm, S1, S2 normal, no murmur, click, rub or gallop Abdomen: soft, non-tender; bowel sounds normal; no masses,  no organomegaly Pulses: 2+ and symmetric Skin: Skin color, texture, turgor normal. No rashes or lesions Lymph nodes: Cervical, supraclavicular, and axillary nodes normal.  Assessment and Plan:  Type II diabetes mellitus, well controlled worsening control, hgba1c now 8.0.  However she is 79 yrs old and unlkiely to be a candidate for insulin. Will  start low dose onglyza.  She is on baby asa, ARB.and statin.   Her foot exam was normal today and she is up to date on eye exams.   Hypertension Well controlled on current regimen. Renal function stable, no changes today.  Hyperlipidemia Statin was resumed in November for LDL of 248 without treatment and liver enzymes remained elevated without statins.  U/s consistent with fatty liver.  LDL is  Improved but not at goal.  Will change to low dose lipitor with  next refill.   Osteoporosis, post-menopausal T scores stable, no prior fractures on bisphosphonate therapy   Fatty liver disease, nonalcoholic secondary to diabetes and hyperlipidemia.  Resuming more aggressive statin and diabetes therapy.    Updated Medication List Outpatient Encounter Prescriptions as of 08/27/2012  Medication Sig Dispense Refill  . aspirin 81 MG tablet Take 81 mg by mouth daily.      . Calcium Carbonate-Vitamin D (CALCIUM + D) 600-200 MG-UNIT TABS Take by mouth.      . diltiazem (DILACOR XR) 240 MG 24 hr capsule Take 1 capsule (240 mg total) by mouth daily.  90 capsule  3  . fish oil-omega-3 fatty acids 1000 MG capsule Take 2 g by mouth daily.      Marland Kitchen glimepiride (AMARYL) 2 MG tablet Take 1 tablet (2 mg total) by mouth 2 (two) times daily.  180 tablet  1  . levothyroxine (SYNTHROID, LEVOTHROID) 75 MCG tablet Take 1 tablet (75 mcg total) by mouth daily.  90 tablet  2  . metFORMIN (GLUCOPHAGE) 500 MG tablet Take 1 tablet (500 mg total) by mouth 2 (two) times daily with a meal.  180 tablet  3  . omeprazole (PRILOSEC) 20 MG capsule Take 1 capsule (20 mg total) by mouth daily.  90 capsule  3  . potassium chloride (K-DUR) 10 MEQ tablet Take 1 tablet (10 mEq total) by mouth daily.  90 tablet  3  . ranitidine (ZANTAC) 75 MG tablet Take 75 mg by mouth 2 (two) times daily.      . valsartan-hydrochlorothiazide (DIOVAN-HCT) 320-25 MG per tablet Take 1 tablet by mouth daily.  90 tablet  3  . [DISCONTINUED] pravastatin (PRAVACHOL) 40 MG tablet Take 1 tablet (40 mg total) by mouth daily.  90 tablet  1  . atorvastatin (LIPITOR) 20 MG tablet Take 2 tablets (40 mg total) by mouth daily.  90 tablet  3  . saxagliptin HCl (ONGLYZA) 2.5 MG TABS tablet Take 1 tablet (2.5 mg total) by mouth daily.  30 tablet  5  . [DISCONTINUED] pneumococcal 23 valent vaccine (PNEUMOVAX 23) 25 MCG/0.5ML injection Inject 0.5 mLs into the muscle once.  0.5 mL  0  . [DISCONTINUED] TDaP (BOOSTRIX) 5-2.5-18.5  LF-MCG/0.5 injection Inject 0.5 mLs into the muscle once.  0.5 mL  0

## 2012-08-28 NOTE — Assessment & Plan Note (Signed)
Well controlled on current regimen. Renal function stable, no changes today. 

## 2012-08-28 NOTE — Assessment & Plan Note (Addendum)
Statin was resumed in November for LDL of 248 without treatment and liver enzymes remained elevated without statins.  U/s consistent with fatty liver.  LDL is  Improved but not at goal.  Will change to low dose lipitor with next refill.

## 2012-08-28 NOTE — Addendum Note (Signed)
Addended by: Sherlene Shams on: 08/28/2012 07:58 PM   Modules accepted: Orders

## 2012-08-28 NOTE — Assessment & Plan Note (Signed)
secondary to diabetes and hyperlipidemia.  Resuming more aggressive statin and diabetes therapy.

## 2012-08-28 NOTE — Assessment & Plan Note (Addendum)
worsening control, hgba1c now 8.0.  However she is 79 yrs old and unlkiely to be a candidate for insulin. Will start low dose onglyza.  She is on baby asa, ARB.and statin.   Her foot exam was normal today and she is up to date on eye exams.

## 2012-08-29 ENCOUNTER — Telehealth: Payer: Self-pay | Admitting: General Practice

## 2012-08-29 DIAGNOSIS — E785 Hyperlipidemia, unspecified: Secondary | ICD-10-CM

## 2012-08-29 MED ORDER — ATORVASTATIN CALCIUM 20 MG PO TABS: 20.0000 mg | ORAL_TABLET | Freq: Every day | ORAL | Status: DC

## 2012-08-29 NOTE — Telephone Encounter (Signed)
i assume yuo are referring to lipitor.  It should be 20 mg one tablet daily  90 day supply. sorry

## 2012-08-29 NOTE — Telephone Encounter (Signed)
Pharmacy wants to know if we would like pt to be on a 40mg  tablet. And is this a 3 month supply?

## 2012-08-29 NOTE — Telephone Encounter (Signed)
Medication error corrected on chart and with pharmacy.

## 2012-09-16 ENCOUNTER — Other Ambulatory Visit: Payer: Self-pay | Admitting: *Deleted

## 2012-09-16 DIAGNOSIS — E119 Type 2 diabetes mellitus without complications: Secondary | ICD-10-CM

## 2012-09-16 MED ORDER — SAXAGLIPTIN HCL 2.5 MG PO TABS: 2.5000 mg | ORAL_TABLET | Freq: Every day | ORAL | Status: DC

## 2012-09-16 NOTE — Telephone Encounter (Signed)
Refill Request Note:  Pt wants to get a 90 day supply-- is that ok?

## 2012-09-16 NOTE — Telephone Encounter (Signed)
Med filled.  

## 2012-11-11 ENCOUNTER — Other Ambulatory Visit: Payer: Self-pay | Admitting: *Deleted

## 2012-11-11 MED ORDER — GLIMEPIRIDE 2 MG PO TABS: 2.0000 mg | ORAL_TABLET | Freq: Two times a day (BID) | ORAL | Status: DC

## 2012-11-11 NOTE — Telephone Encounter (Signed)
Med filled.  

## 2012-11-28 ENCOUNTER — Other Ambulatory Visit (INDEPENDENT_AMBULATORY_CARE_PROVIDER_SITE_OTHER): Payer: Medicare Other

## 2012-11-28 DIAGNOSIS — K7689 Other specified diseases of liver: Secondary | ICD-10-CM

## 2012-11-28 DIAGNOSIS — K760 Fatty (change of) liver, not elsewhere classified: Secondary | ICD-10-CM

## 2012-11-28 DIAGNOSIS — E119 Type 2 diabetes mellitus without complications: Secondary | ICD-10-CM

## 2012-11-28 DIAGNOSIS — E039 Hypothyroidism, unspecified: Secondary | ICD-10-CM

## 2012-11-28 DIAGNOSIS — E785 Hyperlipidemia, unspecified: Secondary | ICD-10-CM

## 2012-11-28 LAB — LIPID PANEL
HDL: 33.3 mg/dL — ABNORMAL LOW (ref >39.00)
Total CHOL/HDL Ratio: 6

## 2012-11-28 LAB — COMPREHENSIVE METABOLIC PANEL
ALT: 71 U/L — ABNORMAL HIGH (ref 0–35)
AST: 99 U/L — ABNORMAL HIGH (ref 0–37)
Calcium: 9.6 mg/dL (ref 8.4–10.5)
Chloride: 102 mEq/L (ref 96–112)
Creatinine, Ser: 1.2 mg/dL (ref 0.4–1.2)

## 2012-11-28 LAB — HEMOGLOBIN A1C: Hgb A1c MFr Bld: 7.8 % — ABNORMAL HIGH (ref 4.6–6.5)

## 2012-12-03 ENCOUNTER — Ambulatory Visit (INDEPENDENT_AMBULATORY_CARE_PROVIDER_SITE_OTHER): Payer: Medicare Other | Admitting: Internal Medicine

## 2012-12-03 ENCOUNTER — Encounter: Payer: Self-pay | Admitting: Internal Medicine

## 2012-12-03 VITALS — BP 158/72 | HR 63 | Temp 97.6°F | Resp 16 | Wt 158.0 lb

## 2012-12-03 DIAGNOSIS — I10 Essential (primary) hypertension: Secondary | ICD-10-CM

## 2012-12-03 DIAGNOSIS — E785 Hyperlipidemia, unspecified: Secondary | ICD-10-CM

## 2012-12-03 NOTE — Progress Notes (Signed)
Patient ID: Pamela Hanna, female   DOB: [birth year], 79 y.o.   MRN: [id number]   Patient Active Problem List   Diagnosis Date Noted  . Fatty liver disease, nonalcoholic 08/28/2012  . History of breast cancer 04/29/2012  . Hypertension 01/16/2012  . GERD (gastroesophageal reflux disease) 01/16/2012  . Unspecified hypothyroidism 01/16/2012  . Hyperlipidemia   . Type II or unspecified type diabetes mellitus without mention of complication, uncontrolled   . Osteoporosis, post-menopausal     Subjective:  CC:   Chief Complaint  Patient presents with  . Follow-up    3 month    HPI:   Pamela Hanna a 79 y.o. female who presents 3 month follow up on diabetes, hyperlipidemia and hypertension. She feels well and is tolerating her medications.  She did not bring a log of her blood sugars but has checked them  Daily in the mornings and they have been under 160.  No recent lows.  Limiting her starches to two or three servings daily  .  No muscle pain, shortness of breath, or chest pain.    Past Medical History  Diagnosis Date  . Breast Cancer 1981    ledt mastectomy  . Diabetes mellitus   . GERD (gastroesophageal reflux disease)   . Osteoporosis, post-menopausal     managedwith bisphosphonates since 2005 with no change  . Hypertension   . Hyperlipidemia     Past Surgical History  Procedure Laterality Date  . Abdominal hysterectomy  1969    menorrhagia  . Cesarean section    . Tumor removal  2006    thoracic  back , benign  . Breast surgery  1981    left radical mastectomy with implant       The following portions of the patient's history were reviewed and updated as appropriate: Allergies, current medications, and problem list.    Review of Systems:   Patient denies headache, fevers, malaise, unintentional weight loss, skin rash, eye pain, sinus congestion and sinus pain, sore throat, dysphagia,  hemoptysis , cough, dyspnea, wheezing, chest pain, palpitations, orthopnea, edema,  abdominal pain, nausea, melena, diarrhea, constipation, flank pain, dysuria, hematuria, urinary  Frequency, nocturia, numbness, tingling, seizures,  Focal weakness, Loss of consciousness,  Tremor, insomnia, depression, anxiety, and suicidal ideation.      History   Social History  . Marital Status: Unknown    Spouse Name: N/A    Number of Children: N/A  . Years of Education: N/A   Occupational History  . Not on file.   Social History Main Topics  . Smoking status: Never Smoker   . Smokeless tobacco: Never Used  . Alcohol Use: No  . Drug Use: No  . Sexually Active: Not on file   Other Topics Concern  . Not on file   Social History Narrative  . No narrative on file    Objective:  BP 158/72  Pulse 63  Temp(Src) 97.6 F (36.4 C) (Oral)  Resp 16  Wt 158 lb (71.668 kg)  BMI 27.11 kg/m2  SpO2 99%  General appearance: alert, cooperative and appears stated age Ears: normal TM's and external ear canals both ears Throat: lips, mucosa, and tongue normal; teeth and gums normal Neck: no adenopathy, no carotid bruit, supple, symmetrical, trachea midline and thyroid not enlarged, symmetric, no tenderness/mass/nodules Back: symmetric, no curvature. ROM normal. No CVA tenderness. Lungs: clear to auscultation bilaterally Heart: regular rate and rhythm, S1, S2 normal, no murmur, click, rub or gallop Abdomen: soft, non-tender; bowel sounds  normal; no masses,  no organomegaly Pulses: 2+ and symmetric Skin: Skin color, texture, turgor normal. No rashes or lesions Lymph nodes: Cervical, supraclavicular, and axillary nodes normal. Foot exam:  Nails are well trimmed,  No callouses,  Sensation intact to microfilament  Assessment and Plan:  Type II or unspecified type diabetes mellitus without mention of complication, uncontrolled A1c improved to 7.8.  Patient asked to submit logs of blood sugars so doses of meds can be adjusted.   Hyperlipidemia improrved with statin therapy, and  liver enzymes are stable.   Hypertension Elevated today.  Reviewed list of meds, patient is not taking OTC meds that could be causing,. It.  Have asked patient to recheck bp at home a minimum of 5 times over the next 4 weeks and call readings to office for adjustment of medications.     Updated Medication List Outpatient Encounter Prescriptions as of 12/03/2012  Medication Sig Dispense Refill  . aspirin 81 MG tablet Take 81 mg by mouth daily.      Marland Kitchen atorvastatin (LIPITOR) 20 MG tablet Take 1 tablet (20 mg total) by mouth daily.  90 tablet  3  . Calcium Carbonate-Vitamin D (CALCIUM + D) 600-200 MG-UNIT TABS Take by mouth.      . diltiazem (DILACOR XR) 240 MG 24 hr capsule Take 1 capsule (240 mg total) by mouth daily.  90 capsule  3  . fish oil-omega-3 fatty acids 1000 MG capsule Take 2 g by mouth daily.      Marland Kitchen glimepiride (AMARYL) 2 MG tablet Take 1 tablet (2 mg total) by mouth 2 (two) times daily.  180 tablet  1  . levothyroxine (SYNTHROID, LEVOTHROID) 75 MCG tablet Take 1 tablet (75 mcg total) by mouth daily.  90 tablet  2  . metFORMIN (GLUCOPHAGE) 500 MG tablet Take 1 tablet (500 mg total) by mouth 2 (two) times daily with a meal.  180 tablet  3  . omeprazole (PRILOSEC) 20 MG capsule Take 1 capsule (20 mg total) by mouth daily.  90 capsule  3  . potassium chloride (K-DUR) 10 MEQ tablet Take 1 tablet (10 mEq total) by mouth daily.  90 tablet  3  . saxagliptin HCl (ONGLYZA) 2.5 MG TABS tablet Take 1 tablet (2.5 mg total) by mouth daily.  90 tablet  1  . valsartan-hydrochlorothiazide (DIOVAN-HCT) 320-25 MG per tablet Take 1 tablet by mouth daily.  90 tablet  3  . ranitidine (ZANTAC) 75 MG tablet Take 75 mg by mouth 2 (two) times daily.       No facility-administered encounter medications on file as of 12/03/2012.     Orders Placed This Encounter  Procedures  . [facility] DIABETES EYE EXAM    No Follow-up on file.

## 2012-12-03 NOTE — Patient Instructions (Addendum)
Your cholesterol is greatly improved  Your diabetes control is a little bit better, but still needs improvement.    Please check your sugars twice a day for the next week.  Pick a different  Meal each day , check blood sugar  right before the meal  And 2 hours after the meal and keep a diary of what you ate for that meal.  Don't skip meals!!!     Your blood pressure is elevated .  Get your blood pressure checked 3 times over the next week (different days) and let me know the readings

## 2012-12-04 ENCOUNTER — Encounter: Payer: Self-pay | Admitting: Internal Medicine

## 2012-12-04 NOTE — Assessment & Plan Note (Signed)
improrved with statin therapy, and liver enzymes are stable.

## 2012-12-04 NOTE — Assessment & Plan Note (Signed)
Elevated today.  Reviewed list of meds, patient is not taking OTC meds that could be causing,. It.  Have asked patient to recheck bp at home a minimum of 5 times over the next 4 weeks and call readings to office for adjustment of medications.   

## 2012-12-04 NOTE — Assessment & Plan Note (Addendum)
A1c improved to 7.8.  Patient asked to check sugars twice daily and submit logs of blood sugars so doses of meds can be adjusted.  She is on asa, statin and ARB.  Foot exam normal.

## 2012-12-16 ENCOUNTER — Telehealth: Payer: Self-pay | Admitting: Internal Medicine

## 2012-12-16 ENCOUNTER — Other Ambulatory Visit: Payer: Self-pay | Admitting: Internal Medicine

## 2012-12-16 DIAGNOSIS — I10 Essential (primary) hypertension: Secondary | ICD-10-CM

## 2012-12-16 NOTE — Telephone Encounter (Signed)
Ok, if her readings are > 140/80 at pharmacy consistently,  We will make a medication change, but not unless.

## 2012-12-16 NOTE — Telephone Encounter (Signed)
I have reviewed her blood sugars and they are very good, but I am concerned about her blood sugar dropping too low before lunch (she recorded a 63) .  Please ask her to eat a little more protein with breakfast or have a mid morning protein snack (cheese, hard boiled egg, ect)  Blood pressures are running high at home.  The diltiazem is the problem.  It's slowing her heart rate down but not doing enough for her blood pressure and is limiting my options.  I would like to know if she was ever put on metoprolol or carvedilol in the past.

## 2012-12-16 NOTE — Assessment & Plan Note (Signed)
Home pressures high.  Will consider changing diltiazem to metoprolol or carvedilol if she has no history of intolerance

## 2012-12-16 NOTE — Telephone Encounter (Signed)
Left message for patient to call office on voice mail 

## 2012-12-16 NOTE — Telephone Encounter (Signed)
Patient notified as requested and stated she will call if BP more than 140/80

## 2012-12-16 NOTE — Telephone Encounter (Signed)
Patient returned call and was advised to add protein to diet as requested and advised of BP running high at home patient stated she is now having BP checked through pharmacy and feels their pressure readings are more reliable. Patient stated she would like to try and give diet and new BP management through pharmacy a try.  Patient has not tried carvedilol or metoproplol.

## 2013-02-14 ENCOUNTER — Other Ambulatory Visit: Payer: Self-pay | Admitting: *Deleted

## 2013-02-14 MED ORDER — LEVOTHYROXINE SODIUM 75 MCG PO TABS: 75.0000 ug | ORAL_TABLET | Freq: Every day | ORAL | Status: DC

## 2013-02-14 MED ORDER — OMEPRAZOLE 20 MG PO CPDR: 20.0000 mg | DELAYED_RELEASE_CAPSULE | Freq: Every day | ORAL | Status: DC

## 2013-03-04 ENCOUNTER — Encounter: Payer: Self-pay | Admitting: Internal Medicine

## 2013-03-04 ENCOUNTER — Ambulatory Visit (INDEPENDENT_AMBULATORY_CARE_PROVIDER_SITE_OTHER): Payer: Medicare Other | Admitting: Internal Medicine

## 2013-03-04 VITALS — BP 138/62 | HR 58 | Temp 98.3°F | Resp 14 | Wt 153.2 lb

## 2013-03-04 DIAGNOSIS — K7689 Other specified diseases of liver: Secondary | ICD-10-CM

## 2013-03-04 DIAGNOSIS — E785 Hyperlipidemia, unspecified: Secondary | ICD-10-CM

## 2013-03-04 DIAGNOSIS — I10 Essential (primary) hypertension: Secondary | ICD-10-CM

## 2013-03-04 DIAGNOSIS — K760 Fatty (change of) liver, not elsewhere classified: Secondary | ICD-10-CM

## 2013-03-04 DIAGNOSIS — IMO0001 Reserved for inherently not codable concepts without codable children: Secondary | ICD-10-CM

## 2013-03-04 LAB — LDL CHOLESTEROL, DIRECT: Direct LDL: 140.7 mg/dL

## 2013-03-04 LAB — COMPREHENSIVE METABOLIC PANEL
AST: 86 U/L — ABNORMAL HIGH (ref 0–37)
Albumin: 4.3 g/dL (ref 3.5–5.2)
Alkaline Phosphatase: 82 U/L (ref 39–117)
Potassium: 4.8 mEq/L (ref 3.5–5.1)
Sodium: 138 mEq/L (ref 135–145)
Total Protein: 7.4 g/dL (ref 6.0–8.3)

## 2013-03-04 LAB — LIPID PANEL
Total CHOL/HDL Ratio: 6
VLDL: 31.8 mg/dL (ref 0.0–40.0)

## 2013-03-04 LAB — MICROALBUMIN / CREATININE URINE RATIO: Creatinine,U: 259.2 mg/dL

## 2013-03-04 NOTE — Assessment & Plan Note (Signed)
Well controlled on current regimen. Renal function stable, no changes today. 

## 2013-03-04 NOTE — Assessment & Plan Note (Signed)
LDL is improved from 248 to 127 on statin therapy.  Hepatic function is stable.

## 2013-03-04 NOTE — Patient Instructions (Addendum)
You are doing great!!  We will contact you with the results of your labs.   Try layering pecans,  Blueberry greek yogurt ([employer]) and whipped cream in a parfait galss for a yummy treat  instead of daily ice cream

## 2013-03-04 NOTE — Progress Notes (Addendum)
Patient ID: Pamela Hanna, female   DOB: Oct 15, 1924, 79 y.o.   MRN: [id number]   Patient Active Problem List   Diagnosis Date Noted  . Fatty liver disease, nonalcoholic 08/28/2012  . History of breast cancer 04/29/2012  . Hypertension 01/16/2012  . GERD (gastroesophageal reflux disease) 01/16/2012  . Unspecified hypothyroidism 01/16/2012  . Hyperlipidemia   . Type II or unspecified type diabetes mellitus without mention of complication, uncontrolled   . Osteoporosis, post-menopausal     Subjective:  CC:   Chief Complaint  Patient presents with  . Follow-up    3 month    HPI:   Pamela Selfis a 79 y.o. female who presents for 3 month follow up on Type 2 DM.  She has lost 5 lbs by cutting out sugars since April.   Specifically cake and cookies.  She is not walking every day butis  staying active woekin in her yard and in the house . No tick bites .  She is tolerating medications without hypoglycemic events muscle pains.  She is not walking barefoot anywhere.   Checks BS QOD and her fasting BS have been ranging from 90 to 124.    Past Medical History  Diagnosis Date  . Breast Cancer 1981    ledt mastectomy  . Diabetes mellitus   . GERD (gastroesophageal reflux disease)   . Osteoporosis, post-menopausal     managedwith bisphosphonates since 2005 with no change  . Hypertension   . Hyperlipidemia     Past Surgical History  Procedure Laterality Date  . Abdominal hysterectomy  1969    menorrhagia  . Cesarean section    . Tumor removal  2006    thoracic  back , benign  . Breast surgery  1981    left radical mastectomy with implant       The following portions of the patient's history were reviewed and updated as appropriate: Allergies, current medications, and problem list.    Review of Systems:   12 Pt  review of systems was negative except those addressed in the HPI,     History   Social History  . Marital Status: Unknown    Spouse Name: N/A    Number of  Children: N/A  . Years of Education: N/A   Occupational History  . Not on file.   Social History Main Topics  . Smoking status: Never Smoker   . Smokeless tobacco: Never Used  . Alcohol Use: No  . Drug Use: No  . Sexually Active: Not on file   Other Topics Concern  . Not on file   Social History Narrative  . No narrative on file    Objective:  BP 138/62  Pulse 58  Temp(Src) 98.3 F (36.8 C) (Oral)  Resp 14  Wt 153 lb 4 oz (69.514 kg)  BMI 26.29 kg/m2  SpO2 98%  General appearance: alert, cooperative and appears stated age Ears: normal TM's and external ear canals both ears Throat: lips, mucosa, and tongue normal; teeth and gums normal Neck: no adenopathy, no carotid bruit, supple, symmetrical, trachea midline and thyroid not enlarged, symmetric, no tenderness/mass/nodules Back: symmetric, no curvature. ROM normal. No CVA tenderness. Lungs: clear to auscultation bilaterally Heart: regular rate and rhythm, S1, S2 normal, no murmur, click, rub or gallop Abdomen: soft, non-tender; bowel sounds normal; no masses,  no organomegaly Pulses: 2+ and symmetric Skin: Skin color, texture, turgor normal. No rashes or lesions Lymph nodes: Cervical, supraclavicular, and axillary nodes normal.  Assessment and Plan:  Type II or unspecified type diabetes mellitus without mention of complication, uncontrolled A1c improved to 7.5.   She is on asa, statin and ARB.  Foot exam normal. No changes today.    Hyperlipidemia LDL is improved from 248 to 127 on statin therapy.  Hepatic function is stable.   Hypertension Well controlled on current regimen. Renal function stable, no changes today.  Fatty liver disease, nonalcoholic Hepatic enzymes are improved with management of DN and hyperlipidemia.    Updated Medication List Outpatient Encounter Prescriptions as of 03/04/2013  Medication Sig Dispense Refill  . aspirin 81 MG tablet Take 81 mg by mouth daily.      Marland Kitchen atorvastatin  (LIPITOR) 20 MG tablet Take 1 tablet (20 mg total) by mouth daily.  90 tablet  3  . Calcium Carbonate-Vitamin D (CALCIUM + D) 600-200 MG-UNIT TABS Take by mouth.      . diltiazem (DILACOR XR) 240 MG 24 hr capsule Take 1 capsule (240 mg total) by mouth daily.  90 capsule  3  . fish oil-omega-3 fatty acids 1000 MG capsule Take 2 g by mouth daily.      Marland Kitchen glimepiride (AMARYL) 2 MG tablet Take 1 tablet (2 mg total) by mouth 2 (two) times daily.  180 tablet  1  . levothyroxine (SYNTHROID, LEVOTHROID) 75 MCG tablet Take 1 tablet (75 mcg total) by mouth daily.  90 tablet  2  . metFORMIN (GLUCOPHAGE) 500 MG tablet Take 1 tablet (500 mg total) by mouth 2 (two) times daily with a meal.  180 tablet  3  . omeprazole (PRILOSEC) 20 MG capsule Take 1 capsule (20 mg total) by mouth daily.  90 capsule  3  . potassium chloride (K-DUR) 10 MEQ tablet Take 1 tablet (10 mEq total) by mouth daily.  90 tablet  3  . ranitidine (ZANTAC) 75 MG tablet Take 75 mg by mouth 2 (two) times daily.      . saxagliptin HCl (ONGLYZA) 2.5 MG TABS tablet Take 1 tablet (2.5 mg total) by mouth daily.  90 tablet  1  . valsartan-hydrochlorothiazide (DIOVAN-HCT) 320-25 MG per tablet Take 1 tablet by mouth daily.  90 tablet  3   No facility-administered encounter medications on file as of 03/04/2013.     Orders Placed This Encounter  Procedures  . Lipid panel  . Hemoglobin A1c  . Comprehensive metabolic panel  . Microalbumin / creatinine urine ratio  . LDL cholesterol, direct    Return in about 3 months (around 06/04/2013).

## 2013-03-04 NOTE — Assessment & Plan Note (Signed)
A1c improved to 7.5.   She is on asa, statin and ARB.  Foot exam normal. No changes today.

## 2013-03-04 NOTE — Assessment & Plan Note (Signed)
Hepatic enzymes are improved with management of DN and hyperlipidemia.

## 2013-03-05 ENCOUNTER — Encounter: Payer: Self-pay | Admitting: *Deleted

## 2013-03-17 ENCOUNTER — Other Ambulatory Visit: Payer: Self-pay | Admitting: *Deleted

## 2013-03-17 DIAGNOSIS — E119 Type 2 diabetes mellitus without complications: Secondary | ICD-10-CM

## 2013-03-17 MED ORDER — SAXAGLIPTIN HCL 2.5 MG PO TABS: 2.5000 mg | ORAL_TABLET | Freq: Every day | ORAL | Status: DC

## 2013-03-24 ENCOUNTER — Other Ambulatory Visit: Payer: Self-pay | Admitting: *Deleted

## 2013-03-24 MED ORDER — METFORMIN HCL 500 MG PO TABS: 500.0000 mg | ORAL_TABLET | Freq: Two times a day (BID) | ORAL | Status: DC

## 2013-04-21 ENCOUNTER — Other Ambulatory Visit: Payer: Self-pay | Admitting: *Deleted

## 2013-04-21 MED ORDER — LEVOTHYROXINE SODIUM 75 MCG PO TABS: 75.0000 ug | ORAL_TABLET | Freq: Every day | ORAL | Status: DC

## 2013-04-21 MED ORDER — DILTIAZEM HCL ER 240 MG PO CP24: 240.0000 mg | ORAL_CAPSULE | Freq: Every day | ORAL | Status: DC

## 2013-04-21 MED ORDER — VALSARTAN-HYDROCHLOROTHIAZIDE 320-25 MG PO TABS: 1.0000 | ORAL_TABLET | Freq: Every day | ORAL | Status: DC

## 2013-04-21 NOTE — Telephone Encounter (Signed)
Eprescribed.

## 2013-04-22 ENCOUNTER — Telehealth: Payer: Self-pay | Admitting: *Deleted

## 2013-04-22 NOTE — Telephone Encounter (Signed)
Refill Request  Contour Next Strips   Test blood glucose level once daily

## 2013-04-28 ENCOUNTER — Telehealth: Payer: Self-pay | Admitting: *Deleted

## 2013-04-28 MED ORDER — GLUCOSE BLOOD VI STRP: ORAL_STRIP | Status: DC

## 2013-04-28 NOTE — Telephone Encounter (Signed)
Refill Request  Contour Next Strips   Test Blood Glucose level once daily

## 2013-04-29 DIAGNOSIS — Z0279 Encounter for issue of other medical certificate: Secondary | ICD-10-CM

## 2013-05-14 ENCOUNTER — Other Ambulatory Visit: Payer: Self-pay | Admitting: *Deleted

## 2013-05-14 MED ORDER — GLIMEPIRIDE 2 MG PO TABS: 2.0000 mg | ORAL_TABLET | Freq: Two times a day (BID) | ORAL | Status: DC

## 2013-05-16 ENCOUNTER — Ambulatory Visit: Payer: Self-pay | Admitting: Internal Medicine

## 2013-06-03 ENCOUNTER — Encounter: Payer: Self-pay | Admitting: Internal Medicine

## 2013-06-10 ENCOUNTER — Encounter (INDEPENDENT_AMBULATORY_CARE_PROVIDER_SITE_OTHER): Payer: Self-pay

## 2013-06-10 ENCOUNTER — Ambulatory Visit (INDEPENDENT_AMBULATORY_CARE_PROVIDER_SITE_OTHER): Payer: Medicare Other | Admitting: Internal Medicine

## 2013-06-10 ENCOUNTER — Encounter: Payer: Self-pay | Admitting: Internal Medicine

## 2013-06-10 VITALS — BP 190/78 | HR 69 | Temp 98.5°F | Resp 14 | Wt 153.2 lb

## 2013-06-10 DIAGNOSIS — E039 Hypothyroidism, unspecified: Secondary | ICD-10-CM

## 2013-06-10 DIAGNOSIS — E785 Hyperlipidemia, unspecified: Secondary | ICD-10-CM

## 2013-06-10 DIAGNOSIS — I10 Essential (primary) hypertension: Secondary | ICD-10-CM

## 2013-06-10 LAB — LIPID PANEL
Cholesterol: 218 mg/dL — ABNORMAL HIGH (ref 0–200)
HDL: 39.8 mg/dL (ref >39.00)
Total CHOL/HDL Ratio: 5
Triglycerides: 174 mg/dL — ABNORMAL HIGH (ref 0.0–149.0)
VLDL: 34.8 mg/dL (ref 0.0–40.0)

## 2013-06-10 LAB — COMPREHENSIVE METABOLIC PANEL
AST: 62 U/L — ABNORMAL HIGH (ref 0–37)
Alkaline Phosphatase: 102 U/L (ref 39–117)
BUN: 23 mg/dL (ref 6–23)
Calcium: 9.9 mg/dL (ref 8.4–10.5)
Chloride: 104 mEq/L (ref 96–112)
Creatinine, Ser: 1.1 mg/dL (ref 0.4–1.2)
Total Bilirubin: 0.7 mg/dL (ref 0.3–1.2)

## 2013-06-10 LAB — HEMOGLOBIN A1C: Hgb A1c MFr Bld: 7.7 % — ABNORMAL HIGH (ref 4.6–6.5)

## 2013-06-10 NOTE — Assessment & Plan Note (Signed)
Elevated today because she did not take her medications.  Return on Friday after taking meds,  Bring home BP meter .

## 2013-06-10 NOTE — Assessment & Plan Note (Signed)
Thyroid function was WNL on current dose.  No current changes unless TSH is not WNL today

## 2013-06-10 NOTE — Assessment & Plan Note (Signed)
Tolerating atorvastatin,.  Fasting lipids due

## 2013-06-10 NOTE — Assessment & Plan Note (Signed)
Well-controlled on current medications.  hemoglobin A1c has been consistently less than 8.0 . sHe is up-to-date on eye exams and his foot exam is normal.  No protienuria by last check .  She is on the appropriate medications.

## 2013-06-10 NOTE — Patient Instructions (Addendum)
We are trying to get your A1c < 7.0 without adding more medications  You can try the [employer] crackers instead of the [facility];   they are lower in carbohydrates  Eat cheese or peanuts with your apple snack   Your blood pressure is too high Return on Friday with your blood pressure machine .  Make sure you have taken your medication for blood pressure that morning

## 2013-06-10 NOTE — Progress Notes (Signed)
Patient ID: Pamela Hanna, female   DOB: 1925/07/10, 79 y.o.   MRN: [id number]  Patient Active Problem List   Diagnosis Date Noted  . Fatty liver disease, nonalcoholic 08/28/2012  . History of breast cancer 04/29/2012  . Hypertension 01/16/2012  . GERD (gastroesophageal reflux disease) 01/16/2012  . Unspecified hypothyroidism 01/16/2012  . Hyperlipidemia   . Type II or unspecified type diabetes mellitus without mention of complication, uncontrolled   . Osteoporosis, post-menopausal     Subjective:  CC:   Chief Complaint  Patient presents with  . Follow-up    HPI:   Pamela Selfis a 79 y.o. female who presents Follow up on diabetes mellitus, hypertension, hypothyroidisn  and hyperlipidemia.  Taking medications as directed but did not take any medications this morning .  No low blood sugars.,  Eats an occasional chocolate bar and notes that it raises her blood sugar.  Diet reviewed,  Eats a lot of apples ,  Peanut butter on crackers.  Doesn't realize that crackers are high in carbohydrates.  No recent falls,  Walks regularly/daily. Very active ,  Went to the [employer] this year.     Past Medical History  Diagnosis Date  . Breast Cancer 1981    ledt mastectomy  . Diabetes mellitus   . GERD (gastroesophageal reflux disease)   . Osteoporosis, post-menopausal     managedwith bisphosphonates since 2005 with no change  . Hypertension   . Hyperlipidemia     Past Surgical History  Procedure Laterality Date  . Abdominal hysterectomy  1969    menorrhagia  . Cesarean section    . Tumor removal  2006    thoracic  back , benign  . Breast surgery  1981    left radical mastectomy with implant       The following portions of the patient's history were reviewed and updated as appropriate: Allergies, current medications, and problem list.    Review of Systems:   12 Pt  review of systems was negative except those addressed in the HPI,     History   Social History  . Marital  Status: Unknown    Spouse Name: N/A    Number of Children: N/A  . Years of Education: N/A   Occupational History  . Not on file.   Social History Main Topics  . Smoking status: Never Smoker   . Smokeless tobacco: Never Used  . Alcohol Use: No  . Drug Use: No  . Sexual Activity: Not on file   Other Topics Concern  . Not on file   Social History Narrative  . No narrative on file    Objective:  Filed Vitals:   06/10/13 0811  BP: 190/78  Pulse:   Temp:   Resp:      General appearance: alert, cooperative and appears stated age Ears: normal TM's and external ear canals both ears Throat: lips, mucosa, and tongue normal; teeth and gums normal Neck: no adenopathy, no carotid bruit, supple, symmetrical, trachea midline and thyroid not enlarged, symmetric, no tenderness/mass/nodules Back: symmetric, no curvature. ROM normal. No CVA tenderness. Lungs: clear to auscultation bilaterally Heart: regular rate and rhythm, S1, S2 normal, no murmur, click, rub or gallop Abdomen: soft, non-tender; bowel sounds normal; no masses,  no organomegaly Pulses: 2+ and symmetric Skin: Skin color, texture, turgor normal. No rashes or lesions Lymph nodes: Cervical, supraclavicular, and axillary nodes normal. Foot exam:  Nails are well trimmed,  No callouses,  Sensation intact to microfilament  Assessment  and Plan:  Hyperlipidemia Tolerating atorvastatin,.  Fasting lipids due   Hypertension Elevated today because she did not take her medications.  Return on Friday after taking meds,  Bring home BP meter .   Type II or unspecified type diabetes mellitus without mention of complication, uncontrolled Well-controlled on current medications.  hemoglobin A1c has been consistently less than 8.0 . sHe is up-to-date on eye exams and his foot exam is normal.  No proteinuria by last check .  She is on the appropriate medications. Lab Results  Component Value Date   HGBA1C 7.7* 06/10/2013     Unspecified hypothyroidism Thyroid function was WNL on current dose.  No current changes unless TSH is not WNL today   Updated Medication List Outpatient Encounter Prescriptions as of 06/10/2013  Medication Sig  . aspirin 81 MG tablet Take 81 mg by mouth daily.  Marland Kitchen atorvastatin (LIPITOR) 20 MG tablet Take 1 tablet (20 mg total) by mouth daily.  . Calcium Carbonate-Vitamin D (CALCIUM + D) 600-200 MG-UNIT TABS Take by mouth.  . diltiazem (DILACOR XR) 240 MG 24 hr capsule Take 1 capsule (240 mg total) by mouth daily.  . fish oil-omega-3 fatty acids 1000 MG capsule Take 2 g by mouth daily.  Marland Kitchen glimepiride (AMARYL) 2 MG tablet Take 1 tablet (2 mg total) by mouth 2 (two) times daily.  Marland Kitchen glucose blood (BAYER CONTOUR NEXT TEST) test strip Use as instructed  . levothyroxine (SYNTHROID, LEVOTHROID) 75 MCG tablet Take 1 tablet (75 mcg total) by mouth daily.  . metFORMIN (GLUCOPHAGE) 500 MG tablet Take 1 tablet (500 mg total) by mouth 2 (two) times daily with a meal.  . omeprazole (PRILOSEC) 20 MG capsule Take 1 capsule (20 mg total) by mouth daily.  . potassium chloride (K-DUR) 10 MEQ tablet Take 1 tablet (10 mEq total) by mouth daily.  . saxagliptin HCl (ONGLYZA) 2.5 MG TABS tablet Take 1 tablet (2.5 mg total) by mouth daily.  . valsartan-hydrochlorothiazide (DIOVAN-HCT) 320-25 MG per tablet Take 1 tablet by mouth daily.  . ranitidine (ZANTAC) 75 MG tablet Take 75 mg by mouth 2 (two) times daily.     Orders Placed This Encounter  Procedures  . Lipid panel  . Hemoglobin A1c  . Comprehensive metabolic panel  . TSH    Return in about 3 months (around 09/10/2013).

## 2013-06-13 ENCOUNTER — Ambulatory Visit: Payer: Medicare Other | Admitting: *Deleted

## 2013-06-13 VITALS — BP 158/62

## 2013-06-13 DIAGNOSIS — I10 Essential (primary) hypertension: Secondary | ICD-10-CM

## 2013-06-15 MED ORDER — AMLODIPINE BESYLATE 5 MG PO TABS: 5.0000 mg | ORAL_TABLET | Freq: Every day | ORAL | Status: DC

## 2013-06-15 NOTE — Addendum Note (Signed)
Addended by: Sherlene Shams on: 06/15/2013 07:56 PM   Modules accepted: Orders

## 2013-06-17 ENCOUNTER — Other Ambulatory Visit: Payer: Self-pay | Admitting: *Deleted

## 2013-06-17 DIAGNOSIS — E119 Type 2 diabetes mellitus without complications: Secondary | ICD-10-CM

## 2013-06-17 MED ORDER — POTASSIUM CHLORIDE ER 10 MEQ PO TBCR: 10.0000 meq | EXTENDED_RELEASE_TABLET | Freq: Every day | ORAL | Status: DC

## 2013-06-17 MED ORDER — SAXAGLIPTIN HCL 2.5 MG PO TABS: 2.5000 mg | ORAL_TABLET | Freq: Every day | ORAL | Status: DC

## 2013-08-25 ENCOUNTER — Telehealth: Payer: Self-pay | Admitting: Internal Medicine

## 2013-08-25 NOTE — Telephone Encounter (Signed)
Left message for to return call to office so I could find out when she started the atorvastatin note to pharmacy was not to dispense until patient completed Pravachol script Please advise. As to whether you think this is causing patient Knee pain.

## 2013-08-25 NOTE — Telephone Encounter (Signed)
Pt returned call

## 2013-08-25 NOTE — Telephone Encounter (Signed)
Stop the atorvastatin and see if the knee pain improves significantly over 2 weeks

## 2013-08-25 NOTE — Telephone Encounter (Signed)
Pt has been taking a new medication (Atorvastatin).  For the past two weeks she has had trouble with knee pain.  States medication instructions state to inform doctor if she is having joint pains or fever.  States she is not having fever but the knee pain is bad.  States it could be arthritis since she has been taking the atorvastatin for so long, but wanted to inform Dr. Derrel Nip.

## 2013-08-26 NOTE — Telephone Encounter (Signed)
Patient notified toStop atorvastatin for 2 weeks and to call office if no improvement . Patient daughter works at an orthopaedic office at [facility] [employer] and advised patient she could receive cortisone shot if symptoms do not improve. Advised patient to do as instructed by her primary and return call to office,

## 2013-09-06 ENCOUNTER — Other Ambulatory Visit: Payer: Self-pay | Admitting: Internal Medicine

## 2013-09-17 ENCOUNTER — Telehealth: Payer: Self-pay | Admitting: Internal Medicine

## 2013-09-17 ENCOUNTER — Ambulatory Visit (INDEPENDENT_AMBULATORY_CARE_PROVIDER_SITE_OTHER): Payer: Medicare Other | Admitting: Internal Medicine

## 2013-09-17 ENCOUNTER — Encounter: Payer: Self-pay | Admitting: Internal Medicine

## 2013-09-17 VITALS — BP 148/64 | HR 73 | Temp 97.5°F | Resp 16 | Wt 158.0 lb

## 2013-09-17 DIAGNOSIS — E559 Vitamin D deficiency, unspecified: Secondary | ICD-10-CM

## 2013-09-17 DIAGNOSIS — K7689 Other specified diseases of liver: Secondary | ICD-10-CM

## 2013-09-17 DIAGNOSIS — E1165 Type 2 diabetes mellitus with hyperglycemia: Principal | ICD-10-CM

## 2013-09-17 DIAGNOSIS — E785 Hyperlipidemia, unspecified: Secondary | ICD-10-CM

## 2013-09-17 DIAGNOSIS — E039 Hypothyroidism, unspecified: Secondary | ICD-10-CM

## 2013-09-17 DIAGNOSIS — K760 Fatty (change of) liver, not elsewhere classified: Secondary | ICD-10-CM

## 2013-09-17 DIAGNOSIS — I10 Essential (primary) hypertension: Secondary | ICD-10-CM

## 2013-09-17 DIAGNOSIS — IMO0001 Reserved for inherently not codable concepts without codable children: Secondary | ICD-10-CM

## 2013-09-17 DIAGNOSIS — Z23 Encounter for immunization: Secondary | ICD-10-CM

## 2013-09-17 LAB — HM DIABETES FOOT EXAM: HM DIABETIC FOOT EXAM: NORMAL

## 2013-09-17 NOTE — Patient Instructions (Addendum)
You are doing well  You received the new Pneumonia vaccine today  Return in 3 months  We will call you with your lab results

## 2013-09-17 NOTE — Telephone Encounter (Signed)
Please advise 

## 2013-09-17 NOTE — Telephone Encounter (Signed)
Fasting Labs need to be done 90+ days from today and the OV either same day or later.

## 2013-09-17 NOTE — Telephone Encounter (Signed)
At checkout pt scheduled her 3 mth f/u appt.  Unsure if pt will need fasting labs at that time.  Pt scheduled at 11 a.m.  If fasting labs are needed, pt will need to be called to come in for an earlier a.m. appt. On a Tuesday or Wednesday.  Pt states she can be reached at home and may leave a msg with new appt date/time if appt needs to be changed.  Prefers Tuesdays or Wednesdays.  Her ride comes from [facility] to pick her up and bring her, so she does not want to come early if it is not necessary for fasting labs.

## 2013-09-17 NOTE — Progress Notes (Signed)
Patient ID: Pamela Hanna, female   DOB: [birth year], 80 y.o.   MRN: [id number]  Patient Active Problem List   Diagnosis Date Noted  . Fatty liver disease, nonalcoholic 22/09/5425  . History of breast cancer 04/29/2012  . Hypertension 01/16/2012  . GERD (gastroesophageal reflux disease) 01/16/2012  . Unspecified hypothyroidism 01/16/2012  . Hyperlipidemia   . Type II or unspecified type diabetes mellitus without mention of complication, not stated as uncontrolled   . Osteoporosis, post-menopausal     Subjective:  CC:   Chief Complaint  Patient presents with  . Follow-up    3 month    HPI:   Pamela Hanna is a 80 y.o. female who presents for  3 month follow up on DM Type 2, hypertension, DJD knees,  and fatty liver.  Her blood sugars were transiently  Increased  to 174 for 2 days from baseline of 94 due to steroid injection  She received a steroid injection in right knee, which helped.significantly.  Her left knee needs to be done  Taking 2 advil every 4 hours maximum 2000 mg daily     Past Medical History  Diagnosis Date  . Breast Cancer 1981    ledt mastectomy  . Diabetes mellitus   . GERD (gastroesophageal reflux disease)   . Osteoporosis, post-menopausal     managedwith bisphosphonates since 2005 with no change  . Hypertension   . Hyperlipidemia     Past Surgical History  Procedure Laterality Date  . Abdominal hysterectomy  1969    menorrhagia  . Cesarean section    . Tumor removal  2006    thoracic  back , benign  . Breast surgery  1981    left radical mastectomy with implant       The following portions of the patient's history were reviewed and updated as appropriate: Allergies, current medications, and problem list.    Review of Systems:   Patient denies headache, fevers, malaise, unintentional weight loss, skin rash, eye pain, sinus congestion and sinus pain, sore throat, dysphagia,  hemoptysis , cough, dyspnea, wheezing, chest pain, palpitations, orthopnea,  edema, abdominal pain, nausea, melena, diarrhea, constipation, flank pain, dysuria, hematuria, urinary  Frequency, nocturia, numbness, tingling, seizures,  Focal weakness, Loss of consciousness,  Tremor, insomnia, depression, anxiety, and suicidal ideation.     History   Social History  . Marital Status: Unknown    Spouse Name: N/A    Number of Children: N/A  . Years of Education: N/A   Occupational History  . Not on file.   Social History Main Topics  . Smoking status: Never Smoker   . Smokeless tobacco: Never Used  . Alcohol Use: No  . Drug Use: No  . Sexual Activity: Not on file   Other Topics Concern  . Not on file   Social History Narrative  . No narrative on file    Objective:  Filed Vitals:   09/17/13 1447  BP: 148/64  Pulse: 73  Temp: 97.5 F (36.4 C)  Resp: 16     General appearance: alert, cooperative and appears stated age Ears: normal TM's and external ear canals both ears Throat: lips, mucosa, and tongue normal; teeth and gums normal Neck: no adenopathy, no carotid bruit, supple, symmetrical, trachea midline and thyroid not enlarged, symmetric, no tenderness/mass/nodules Back: symmetric, no curvature. ROM normal. No CVA tenderness. Lungs: clear to auscultation bilaterally Heart: regular rate and rhythm, S1, S2 normal, no murmur, click, rub or gallop Abdomen: soft, non-tender; bowel sounds normal;  no masses,  no organomegaly Pulses: 2+ and symmetric Skin: Skin color, texture, turgor normal. No rashes or lesions Lymph nodes: Cervical, supraclavicular, and axillary nodes normal.  Assessment and Plan:  Hypertension Well controlled on current regimen. Renal function stable, no changes today.  Lab Results  Component Value Date   CREATININE 1.2 09/17/2013      Type II or unspecified type diabetes mellitus without mention of complication, not stated as uncontrolled Well-controlled on current medications.  hemoglobin A1c has been consistently less  than 8.0 . She is up-to-date on eye exams and her foot exam is normal. l we'll repeat her urine microalbumin to creatinine ratio at next visit. She is on the appropriate medications.  Lab Results  Component Value Date   HGBA1C 7.7* 09/17/2013   Lab Results  Component Value Date   MICROALBUR 2.6* 03/04/2013    Hyperlipidemia Improved on current statin therapy.   Liver enzymes are stable  , no changes today.  Lab Results  Component Value Date   CHOL 218* 06/10/2013   HDL 39.80 06/10/2013   LDLCALC 127* 11/28/2012   LDLDIRECT 149.0 06/10/2013   TRIG 174.0* 06/10/2013   CHOLHDL 5 06/10/2013   . Lab Results  Component Value Date   ALT 49* 09/17/2013   AST 50* 09/17/2013   ALKPHOS 108 09/17/2013   BILITOT 0.6 09/17/2013     Fatty liver disease, nonalcoholic Hepatic enzymes are improved with management of DM and hyperlipidemia.      Updated Medication List Outpatient Encounter Prescriptions as of 09/17/2013  Medication Sig  . amLODipine (NORVASC) 5 MG tablet Take 1 tablet (5 mg total) by mouth daily.  Marland Kitchen aspirin 81 MG tablet Take 81 mg by mouth daily.  Marland Kitchen atorvastatin (LIPITOR) 20 MG tablet Take 1 tablet (20 mg total) by mouth daily.  Marland Kitchen atorvastatin (LIPITOR) 20 MG tablet TAKE ONE (1) TABLET BY MOUTH EVERY DAY  . Calcium Carbonate-Vitamin D (CALCIUM + D) 600-200 MG-UNIT TABS Take by mouth.  . diltiazem (DILACOR XR) 240 MG 24 hr capsule Take 1 capsule (240 mg total) by mouth daily.  . fish oil-omega-3 fatty acids 1000 MG capsule Take 2 g by mouth daily.  Marland Kitchen glimepiride (AMARYL) 2 MG tablet Take 1 tablet (2 mg total) by mouth 2 (two) times daily.  Marland Kitchen glucose blood (BAYER CONTOUR NEXT TEST) test strip Use as instructed  . levothyroxine (SYNTHROID, LEVOTHROID) 75 MCG tablet Take 1 tablet (75 mcg total) by mouth daily.  . metFORMIN (GLUCOPHAGE) 500 MG tablet Take 1 tablet (500 mg total) by mouth 2 (two) times daily with a meal.  . omeprazole (PRILOSEC) 20 MG capsule Take 1 capsule (20 mg  total) by mouth daily.  . potassium chloride (K-DUR) 10 MEQ tablet TAKE ONE (1) TABLET EACH DAY  . ranitidine (ZANTAC) 75 MG tablet Take 75 mg by mouth 2 (two) times daily.  . saxagliptin HCl (ONGLYZA) 2.5 MG TABS tablet Take 1 tablet (2.5 mg total) by mouth daily.  . valsartan-hydrochlorothiazide (DIOVAN-HCT) 320-25 MG per tablet Take 1 tablet by mouth daily.     Orders Placed This Encounter  Procedures  . Pneumococcal conjugate vaccine 13-valent  . Comprehensive metabolic panel  . Hemoglobin A1c  . TSH  . Vit D  25 hydroxy (rtn osteoporosis monitoring)  . HM DIABETES FOOT EXAM    No Follow-up on file.

## 2013-09-17 NOTE — Progress Notes (Signed)
Pre-visit discussion using our clinic review tool. No additional management support is needed unless otherwise documented below in the visit note.  

## 2013-09-18 LAB — VITAMIN D 25 HYDROXY (VIT D DEFICIENCY, FRACTURES): VIT D 25 HYDROXY: 55 ng/mL (ref 30–89)

## 2013-09-18 LAB — HEMOGLOBIN A1C: HEMOGLOBIN A1C: 7.7 % — AB (ref 4.6–6.5)

## 2013-09-18 LAB — TSH: TSH: 4.27 u[IU]/mL (ref 0.35–5.50)

## 2013-09-18 LAB — COMPREHENSIVE METABOLIC PANEL
ALT: 49 U/L — ABNORMAL HIGH (ref 0–35)
AST: 50 U/L — ABNORMAL HIGH (ref 0–37)
Albumin: 4.1 g/dL (ref 3.5–5.2)
Alkaline Phosphatase: 108 U/L (ref 39–117)
BILIRUBIN TOTAL: 0.6 mg/dL (ref 0.3–1.2)
BUN: 30 mg/dL — ABNORMAL HIGH (ref 6–23)
CHLORIDE: 108 meq/L (ref 96–112)
CO2: 25 mEq/L (ref 19–32)
CREATININE: 1.2 mg/dL (ref 0.4–1.2)
Calcium: 9.9 mg/dL (ref 8.4–10.5)
GFR: 45.17 mL/min — AB (ref >60.00)
Glucose, Bld: 195 mg/dL — ABNORMAL HIGH (ref 70–99)
Potassium: 4.7 mEq/L (ref 3.5–5.1)
SODIUM: 140 meq/L (ref 135–145)
Total Protein: 7.3 g/dL (ref 6.0–8.3)

## 2013-09-18 NOTE — Assessment & Plan Note (Signed)
Well-controlled on current medications.  hemoglobin A1c has been consistently less than 8.0 . She is up-to-date on eye exams and her foot exam is normal. l we'll repeat her urine microalbumin to creatinine ratio at next visit. She is on the appropriate medications.  Lab Results  Component Value Date   HGBA1C 7.7* 09/17/2013   Lab Results  Component Value Date   MICROALBUR 2.6* 03/04/2013

## 2013-09-18 NOTE — Assessment & Plan Note (Signed)
Improved on current statin therapy.   Liver enzymes are stable  , no changes today.  Lab Results  Component Value Date   CHOL 218* 06/10/2013   HDL 39.80 06/10/2013   LDLCALC 127* 11/28/2012   LDLDIRECT 149.0 06/10/2013   TRIG 174.0* 06/10/2013   CHOLHDL 5 06/10/2013   . Lab Results  Component Value Date   ALT 49* 09/17/2013   AST 50* 09/17/2013   ALKPHOS 108 09/17/2013   BILITOT 0.6 09/17/2013

## 2013-09-18 NOTE — Assessment & Plan Note (Signed)
Hepatic enzymes are improved with management of DM and hyperlipidemia.

## 2013-09-18 NOTE — Assessment & Plan Note (Addendum)
Well controlled on current regimen. Renal function stable, no changes today.  Lab Results  Component Value Date   CREATININE 1.2 09/17/2013

## 2013-09-18 NOTE — Progress Notes (Signed)
Quick Note:  Your diabetes is still under good control on current regimen, but your a1c is still above goal. your other labs are normal. No medication changes are needed at this time, but please try to limit the number of complex carbohydrates (starches, including white potatoes, Cereals, Cookies and cake) in your diet to as few as possible and make sure you are getting some exercise regularly Please return in 3 months   Regards,   Dr. Derrel Nip    ______

## 2013-09-19 ENCOUNTER — Telehealth: Payer: Self-pay

## 2013-09-19 NOTE — Telephone Encounter (Signed)
Relevant patient education assigned to patient using Emmi. ° °

## 2013-09-22 ENCOUNTER — Encounter: Payer: Self-pay | Admitting: *Deleted

## 2013-10-20 LAB — HM DIABETES EYE EXAM

## 2013-11-04 DIAGNOSIS — Z0279 Encounter for issue of other medical certificate: Secondary | ICD-10-CM

## 2013-11-08 ENCOUNTER — Other Ambulatory Visit: Payer: Self-pay | Admitting: Internal Medicine

## 2013-12-17 ENCOUNTER — Encounter: Payer: Self-pay | Admitting: Internal Medicine

## 2013-12-17 ENCOUNTER — Ambulatory Visit: Payer: Medicare Other | Admitting: Internal Medicine

## 2013-12-17 ENCOUNTER — Ambulatory Visit (INDEPENDENT_AMBULATORY_CARE_PROVIDER_SITE_OTHER): Payer: Medicare Other | Admitting: Internal Medicine

## 2013-12-17 VITALS — BP 118/60 | HR 60 | Temp 97.7°F | Resp 16 | Wt 151.5 lb

## 2013-12-17 DIAGNOSIS — R29818 Other symptoms and signs involving the nervous system: Secondary | ICD-10-CM

## 2013-12-17 DIAGNOSIS — I10 Essential (primary) hypertension: Secondary | ICD-10-CM

## 2013-12-17 DIAGNOSIS — E785 Hyperlipidemia, unspecified: Secondary | ICD-10-CM

## 2013-12-17 DIAGNOSIS — K760 Fatty (change of) liver, not elsewhere classified: Secondary | ICD-10-CM

## 2013-12-17 DIAGNOSIS — R21 Rash and other nonspecific skin eruption: Secondary | ICD-10-CM

## 2013-12-17 DIAGNOSIS — R2689 Other abnormalities of gait and mobility: Secondary | ICD-10-CM

## 2013-12-17 DIAGNOSIS — E119 Type 2 diabetes mellitus without complications: Secondary | ICD-10-CM

## 2013-12-17 DIAGNOSIS — K7689 Other specified diseases of liver: Secondary | ICD-10-CM

## 2013-12-17 LAB — LIPID PANEL
CHOL/HDL RATIO: 5
CHOLESTEROL: 197 mg/dL (ref 0–200)
HDL: 39.7 mg/dL (ref >39.00)
LDL Cholesterol: 125 mg/dL — ABNORMAL HIGH (ref 0–99)
Triglycerides: 162 mg/dL — ABNORMAL HIGH (ref 0.0–149.0)
VLDL: 32.4 mg/dL (ref 0.0–40.0)

## 2013-12-17 LAB — COMPREHENSIVE METABOLIC PANEL
ALT: 35 U/L (ref 0–35)
AST: 51 U/L — ABNORMAL HIGH (ref 0–37)
Albumin: 4.5 g/dL (ref 3.5–5.2)
Alkaline Phosphatase: 86 U/L (ref 39–117)
BUN: 43 mg/dL — ABNORMAL HIGH (ref 6–23)
CALCIUM: 9.9 mg/dL (ref 8.4–10.5)
CHLORIDE: 107 meq/L (ref 96–112)
CO2: 22 mEq/L (ref 19–32)
Creatinine, Ser: 1.5 mg/dL — ABNORMAL HIGH (ref 0.4–1.2)
GFR: 33.85 mL/min — ABNORMAL LOW (ref >60.00)
GLUCOSE: 128 mg/dL — AB (ref 70–99)
Potassium: 5.2 mEq/L — ABNORMAL HIGH (ref 3.5–5.1)
Sodium: 138 mEq/L (ref 135–145)
Total Bilirubin: 0.9 mg/dL (ref 0.2–1.2)
Total Protein: 7.6 g/dL (ref 6.0–8.3)

## 2013-12-17 LAB — MICROALBUMIN / CREATININE URINE RATIO
CREATININE, U: 253.1 mg/dL
MICROALB UR: 2.7 mg/dL — AB (ref 0.0–1.9)
Microalb Creat Ratio: 1.1 mg/g (ref 0.0–30.0)

## 2013-12-17 LAB — HEMOGLOBIN A1C: Hgb A1c MFr Bld: 7.6 % — ABNORMAL HIGH (ref 4.6–6.5)

## 2013-12-17 NOTE — Progress Notes (Signed)
Patient ID: Pamela Hanna, female   DOB: Nov 22, 1924, 80 y.o.   MRN: [id number]   Patient Active Problem List   Diagnosis Date Noted  . Fatty liver disease, nonalcoholic 41/66/0630  . History of breast cancer 04/29/2012  . Hypertension 01/16/2012  . GERD (gastroesophageal reflux disease) 01/16/2012  . Unspecified hypothyroidism 01/16/2012  . Hyperlipidemia   . Type II or unspecified type diabetes mellitus without mention of complication, not stated as uncontrolled   . Osteoporosis, post-menopausal     Subjective:  CC:   Chief Complaint  Patient presents with  . Follow-up    Mole on left ear. skin broken.  . Diabetes    HPI:   Pamela Hanna is a 80 y.o. female who presents for Diabetes follow up.  Her a1c is due.  fastings have been  < 120 an dpost prandials have been elevated to  170.  She has had her annual eye exam by [facility];  no retinopathy  Some ankle edema,  Very mild,  Has been sitting a lot.  Taking valsartan/hct. Has noted a decreasd in sense of  balance without any falls.  Does not exercise regularly.     Itchy Leg rash treated by Dr. Koleen Nimrod with triamcinolone cream in January,  Has refilled it  2/21 The rash initially resolved with use of the cream but it has returned more recently without pruritus and has been present for  past 3 weeks.  Not spreading,  Confined to the legs.  Has been out in the yard but no history of tick bites. Does have a lap cat, indoor only .  Denies fever  No headaches.    Past Medical History  Diagnosis Date  . Breast Cancer 1981    ledt mastectomy  . Diabetes mellitus   . GERD (gastroesophageal reflux disease)   . Osteoporosis, post-menopausal     managedwith bisphosphonates since 2005 with no change  . Hypertension   . Hyperlipidemia     Past Surgical History  Procedure Laterality Date  . Abdominal hysterectomy  1969    menorrhagia  . Cesarean section    . Tumor removal  2006    thoracic  back , benign  . Breast surgery  1981   left radical mastectomy with implant       The following portions of the patient's history were reviewed and updated as appropriate: Allergies, current medications, and problem list.    Review of Systems:   Patient denies headache, fevers, malaise, unintentional weight loss, skin rash, eye pain, sinus congestion and sinus pain, sore throat, dysphagia,  hemoptysis , cough, dyspnea, wheezing, chest pain, palpitations, orthopnea, edema, abdominal pain, nausea, melena, diarrhea, constipation, flank pain, dysuria, hematuria, urinary  Frequency, nocturia, numbness, tingling, seizures,  Focal weakness, Loss of consciousness,  Tremor, insomnia, depression, anxiety, and suicidal ideation.     History   Social History  . Marital Status: Unknown    Spouse Name: N/A    Number of Children: N/A  . Years of Education: N/A   Occupational History  . Not on file.   Social History Main Topics  . Smoking status: Never Smoker   . Smokeless tobacco: Never Used  . Alcohol Use: No  . Drug Use: No  . Sexual Activity: Not on file   Other Topics Concern  . Not on file   Social History Narrative  . No narrative on file    Objective:  Filed Vitals:   12/17/13 0930  BP: 118/60  Pulse: 60  Temp: 97.7 F (36.5 C)  Resp: 16     General appearance: alert, cooperative and appears stated age Ears: normal TM's and external ear canals both ears Throat: lips, mucosa, and tongue normal; teeth and gums normal Neck: no adenopathy, no carotid bruit, supple, symmetrical, trachea midline and thyroid not enlarged, symmetric, no tenderness/mass/nodules Back: symmetric, no curvature. ROM normal. No CVA tenderness. Lungs: clear to auscultation bilaterally Heart: regular rate and rhythm, S1, S2 normal, no murmur, click, rub or gallop Abdomen: soft, non-tender; bowel sounds normal; no masses,  no organomegaly Pulses: 2+ and symmetric Skin: Skin color, texture, turgor normal. No rashes or lesions Lymph  nodes: Cervical, supraclavicular, and axillary nodes normal.  Assessment and Plan:  Hypertension Well controlled, but cr has risen  To 1.6  And potassium is slightly elevated Will dc hct , potassium supplement and repeat BMET in one week on valsartan.  Lab Results  Component Value Date   CREATININE 1.5* 12/17/2013   Lab Results  Component Value Date   NA 138 12/17/2013   K 5.2* 12/17/2013   CL 107 12/17/2013   CO2 22 12/17/2013     Type II or unspecified type diabetes mellitus without mention of complication, not stated as uncontrolled A1c remains < 7.0 .  Will increase onglyza to 5 mg daily  . She is up-to-date on eye exams and her foot exam is normal. l we'll repeat her urine microalbumin to creatinine ratio at next visit. She is on the appropriate medications.  Lab Results  Component Value Date   HGBA1C 7.6* 12/17/2013   Lab Results  Component Value Date   MICROALBUR 2.7* 12/17/2013      Rash and nonspecific skin eruption Etiology unclear.  It is not petechial and not pruritic. Advised to return to dermatology for skin biopsy  Loss of balance Her neurologic exam is normal.  Symptoms are mild  She is able to rise from a seated position unassisted.  Recommended participation in a [place] program to improve balance and strength of core muscles.   Fatty liver disease, nonalcoholic Presumed by ultrasound changes and serologies negative for autoimmune causes of hepatitis.  Current liver enzymes are stable and all modifiable risk factors including obesity, diabetes and hyperlipidemia have been addressed  Lab Results  Component Value Date   ALT 35 12/17/2013   AST 51* 12/17/2013   ALKPHOS 86 12/17/2013   BILITOT 0.9 12/17/2013    Updated Medication List Outpatient Encounter Prescriptions as of 12/17/2013  Medication Sig  . amLODipine (NORVASC) 5 MG tablet Take 1 tablet (5 mg total) by mouth daily.  Marland Kitchen aspirin 81 MG tablet Take 81 mg by mouth daily.  Marland Kitchen atorvastatin  (LIPITOR) 20 MG tablet TAKE ONE (1) TABLET BY MOUTH EVERY DAY  . Calcium Carbonate-Vitamin D (CALCIUM + D) 600-200 MG-UNIT TABS Take by mouth.  . diltiazem (DILACOR XR) 240 MG 24 hr capsule Take 1 capsule (240 mg total) by mouth daily.  . fish oil-omega-3 fatty acids 1000 MG capsule Take 2 g by mouth daily.  Marland Kitchen glimepiride (AMARYL) 2 MG tablet TAKE ONE (1) TABLET BY MOUTH TWO (2) TIMES DAILY  . glucose blood (BAYER CONTOUR NEXT TEST) test strip Use as instructed  . levothyroxine (SYNTHROID, LEVOTHROID) 75 MCG tablet Take 1 tablet (75 mcg total) by mouth daily.  . metFORMIN (GLUCOPHAGE) 500 MG tablet Take 1 tablet (500 mg total) by mouth 2 (two) times daily with a meal.  . omeprazole (PRILOSEC) 20 MG capsule Take 1 capsule (  20 mg total) by mouth daily.  . ranitidine (ZANTAC) 75 MG tablet Take 75 mg by mouth 2 (two) times daily.  . [DISCONTINUED] atorvastatin (LIPITOR) 20 MG tablet Take 1 tablet (20 mg total) by mouth daily.  . [DISCONTINUED] potassium chloride (K-DUR) 10 MEQ tablet TAKE ONE (1) TABLET EACH DAY  . [DISCONTINUED] saxagliptin HCl (ONGLYZA) 2.5 MG TABS tablet Take 1 tablet (2.5 mg total) by mouth daily.  . [DISCONTINUED] valsartan-hydrochlorothiazide (DIOVAN-HCT) 320-25 MG per tablet Take 1 tablet by mouth daily.  . saxagliptin HCl (ONGLYZA) 5 MG TABS tablet Take 1 tablet (5 mg total) by mouth daily.  . valsartan (DIOVAN) 320 MG tablet Take 1 tablet (320 mg total) by mouth daily.

## 2013-12-17 NOTE — Progress Notes (Signed)
Pre-visit discussion using our clinic review tool. No additional management support is needed unless otherwise documented below in the visit note.  

## 2013-12-17 NOTE — Patient Instructions (Signed)
Your balance problems may be due to allergies affecting your middle ear, or due to loss of muscle strength in your core (torso) and hips  I recommended participation in a [place] exercise program  Diabetes and Exercise Exercising regularly is important. It is not just about losing weight. It has many health benefits, such as:  Improving your overall fitness, flexibility, and endurance.  Increasing your bone density.  Helping with weight control.  Decreasing your body fat.  Increasing your muscle strength.  Reducing stress and tension.  Improving your overall health. People with diabetes who exercise gain additional benefits because exercise:  Reduces appetite.  Improves the body's use of blood sugar (glucose).  Helps lower or control blood glucose.  Decreases blood pressure.  Helps control blood lipids (such as cholesterol and triglycerides).  Improves the body's use of the hormone insulin by:  Increasing the body's insulin sensitivity.  Reducing the body's insulin needs.  Decreases the risk for heart disease because exercising:  Lowers cholesterol and triglycerides levels.  Increases the levels of good cholesterol (such as high-density lipoproteins [HDL]) in the body.  Lowers blood glucose levels. YOUR ACTIVITY PLAN  Choose an activity that you enjoy and set realistic goals. Your health care provider or diabetes educator can help you make an activity plan that works for you. You can break activities into 2 or 3 sessions throughout the day. Doing so is as good as one long session. Exercise ideas include:  Taking the dog for a walk.  Taking the stairs instead of the elevator.  Dancing to your favorite song.  Doing your favorite exercise with a friend. RECOMMENDATIONS FOR EXERCISING WITH TYPE 1 OR TYPE 2 DIABETES   Check your blood glucose before exercising. If blood glucose levels are greater than 240 mg/dL, check for urine ketones. Do not exercise if  ketones are present.  Avoid injecting insulin into areas of the body that are going to be exercised. For example, avoid injecting insulin into:  The arms when playing tennis.  The legs when jogging.  Keep a record of:  Food intake before and after you exercise.  Expected peak times of insulin action.  Blood glucose levels before and after you exercise.  The type and amount of exercise you have done.  Review your records with your health care provider. Your health care provider will help you to develop guidelines for adjusting food intake and insulin amounts before and after exercising.  If you take insulin or oral hypoglycemic agents, watch for signs and symptoms of hypoglycemia. They include:  Dizziness.  Shaking.  Sweating.  Chills.  Confusion.  Drink plenty of water while you exercise to prevent dehydration or heat stroke. Body water is lost during exercise and must be replaced.  Talk to your health care provider before starting an exercise program to make sure it is safe for you. Remember, almost any type of activity is better than none. Document Released: 10/14/2003 Document Revised: 03/26/2013 Document Reviewed: 12/31/2012 [facility] Patient Information 2014 [employer].

## 2013-12-18 ENCOUNTER — Encounter: Payer: Self-pay | Admitting: Internal Medicine

## 2013-12-18 DIAGNOSIS — R21 Rash and other nonspecific skin eruption: Secondary | ICD-10-CM | POA: Insufficient documentation

## 2013-12-18 DIAGNOSIS — M6281 Muscle weakness (generalized): Secondary | ICD-10-CM | POA: Insufficient documentation

## 2013-12-18 MED ORDER — SAXAGLIPTIN HCL 5 MG PO TABS: 5.0000 mg | ORAL_TABLET | Freq: Every day | ORAL | Status: DC

## 2013-12-18 MED ORDER — VALSARTAN 320 MG PO TABS: 320.0000 mg | ORAL_TABLET | Freq: Every day | ORAL | Status: DC

## 2013-12-18 NOTE — Assessment & Plan Note (Signed)
A1c remains < 7.0 .  Will increase onglyza to 5 mg daily  . She is up-to-date on eye exams and her foot exam is normal. l we'll repeat her urine microalbumin to creatinine ratio at next visit. She is on the appropriate medications.  Lab Results  Component Value Date   HGBA1C 7.6* 12/17/2013   Lab Results  Component Value Date   MICROALBUR 2.7* 12/17/2013

## 2013-12-18 NOTE — Assessment & Plan Note (Signed)
Etiology unclear.  It is not petechial and not pruritic. Advised to return to dermatology for skin biopsy

## 2013-12-18 NOTE — Assessment & Plan Note (Signed)
Her neurologic exam is normal.  Symptoms are mild  She is able to rise from a seated position unassisted.  Recommended participation in a [place] program to improve balance and strength of core muscles.

## 2013-12-18 NOTE — Assessment & Plan Note (Signed)
Presumed by ultrasound changes and serologies negative for autoimmune causes of hepatitis.  Current liver enzymes are stable and all modifiable risk factors including obesity, diabetes and hyperlipidemia have been addressed  Lab Results  Component Value Date   ALT 35 12/17/2013   AST 51* 12/17/2013   ALKPHOS 86 12/17/2013   BILITOT 0.9 12/17/2013

## 2013-12-18 NOTE — Assessment & Plan Note (Addendum)
Well controlled, but cr has risen  To 1.6  And potassium is slightly elevated Will dc hct , potassium supplement and repeat BMET in one week on valsartan.  Lab Results  Component Value Date   CREATININE 1.5* 12/17/2013   Lab Results  Component Value Date   NA 138 12/17/2013   K 5.2* 12/17/2013   CL 107 12/17/2013   CO2 22 12/17/2013

## 2013-12-30 ENCOUNTER — Telehealth: Payer: Self-pay | Admitting: *Deleted

## 2013-12-30 ENCOUNTER — Other Ambulatory Visit (INDEPENDENT_AMBULATORY_CARE_PROVIDER_SITE_OTHER): Payer: Medicare Other

## 2013-12-30 DIAGNOSIS — E876 Hypokalemia: Secondary | ICD-10-CM

## 2013-12-30 LAB — BASIC METABOLIC PANEL
BUN: 25 mg/dL — AB (ref 6–23)
CO2: 23 meq/L (ref 19–32)
CREATININE: 1.2 mg/dL (ref 0.4–1.2)
Calcium: 9.7 mg/dL (ref 8.4–10.5)
Chloride: 108 mEq/L (ref 96–112)
GFR: 44.28 mL/min — ABNORMAL LOW (ref >60.00)
Glucose, Bld: 155 mg/dL — ABNORMAL HIGH (ref 70–99)
Potassium: 5.2 mEq/L — ABNORMAL HIGH (ref 3.5–5.1)
Sodium: 139 mEq/L (ref 135–145)

## 2013-12-30 NOTE — Telephone Encounter (Signed)
What labs and dX?  

## 2013-12-31 ENCOUNTER — Other Ambulatory Visit: Payer: Self-pay | Admitting: Internal Medicine

## 2013-12-31 ENCOUNTER — Encounter: Payer: Self-pay | Admitting: Internal Medicine

## 2013-12-31 DIAGNOSIS — E119 Type 2 diabetes mellitus without complications: Secondary | ICD-10-CM

## 2013-12-31 DIAGNOSIS — E875 Hyperkalemia: Secondary | ICD-10-CM | POA: Insufficient documentation

## 2014-01-10 ENCOUNTER — Other Ambulatory Visit: Payer: Self-pay | Admitting: Internal Medicine

## 2014-01-11 ENCOUNTER — Encounter: Payer: Self-pay | Admitting: Internal Medicine

## 2014-01-13 MED ORDER — FUROSEMIDE 20 MG PO TABS: 20.0000 mg | ORAL_TABLET | Freq: Every day | ORAL | Status: AC

## 2014-01-17 ENCOUNTER — Other Ambulatory Visit: Payer: Self-pay | Admitting: Internal Medicine

## 2014-02-05 ENCOUNTER — Encounter: Payer: Self-pay | Admitting: Internal Medicine

## 2014-02-05 DIAGNOSIS — B029 Zoster without complications: Secondary | ICD-10-CM

## 2014-02-06 MED ORDER — ACYCLOVIR 400 MG PO TABS: 400.0000 mg | ORAL_TABLET | Freq: Every day | ORAL | Status: DC

## 2014-02-09 ENCOUNTER — Other Ambulatory Visit: Payer: Self-pay | Admitting: Internal Medicine

## 2014-02-13 ENCOUNTER — Other Ambulatory Visit: Payer: Self-pay | Admitting: Internal Medicine

## 2014-02-13 MED ORDER — SAXAGLIPTIN HCL 5 MG PO TABS: ORAL_TABLET | ORAL | Status: DC

## 2014-02-13 NOTE — Telephone Encounter (Signed)
Refill sent.

## 2014-02-16 ENCOUNTER — Other Ambulatory Visit: Payer: Self-pay | Admitting: *Deleted

## 2014-02-16 MED ORDER — DIOVAN 320 MG PO TABS: ORAL_TABLET | ORAL | Status: DC

## 2014-02-21 ENCOUNTER — Other Ambulatory Visit: Payer: Self-pay | Admitting: Internal Medicine

## 2014-03-07 ENCOUNTER — Other Ambulatory Visit: Payer: Self-pay | Admitting: Internal Medicine

## 2014-03-21 ENCOUNTER — Other Ambulatory Visit: Payer: Self-pay | Admitting: Internal Medicine

## 2014-04-01 ENCOUNTER — Ambulatory Visit (INDEPENDENT_AMBULATORY_CARE_PROVIDER_SITE_OTHER): Payer: Medicare Other | Admitting: Internal Medicine

## 2014-04-01 ENCOUNTER — Encounter: Payer: Self-pay | Admitting: Internal Medicine

## 2014-04-01 VITALS — BP 148/60 | HR 59 | Temp 97.6°F | Resp 16 | Ht 64.0 in | Wt 150.2 lb

## 2014-04-01 DIAGNOSIS — E119 Type 2 diabetes mellitus without complications: Secondary | ICD-10-CM

## 2014-04-01 DIAGNOSIS — R609 Edema, unspecified: Secondary | ICD-10-CM

## 2014-04-01 DIAGNOSIS — R21 Rash and other nonspecific skin eruption: Secondary | ICD-10-CM

## 2014-04-01 DIAGNOSIS — R29818 Other symptoms and signs involving the nervous system: Secondary | ICD-10-CM

## 2014-04-01 DIAGNOSIS — E875 Hyperkalemia: Secondary | ICD-10-CM

## 2014-04-01 DIAGNOSIS — Z23 Encounter for immunization: Secondary | ICD-10-CM

## 2014-04-01 DIAGNOSIS — K7689 Other specified diseases of liver: Secondary | ICD-10-CM

## 2014-04-01 DIAGNOSIS — R2689 Other abnormalities of gait and mobility: Secondary | ICD-10-CM

## 2014-04-01 DIAGNOSIS — I10 Essential (primary) hypertension: Secondary | ICD-10-CM

## 2014-04-01 DIAGNOSIS — E785 Hyperlipidemia, unspecified: Secondary | ICD-10-CM

## 2014-04-01 DIAGNOSIS — K760 Fatty (change of) liver, not elsewhere classified: Secondary | ICD-10-CM

## 2014-04-01 LAB — MICROALBUMIN / CREATININE URINE RATIO
Creatinine,U: 250.5 mg/dL
MICROALB/CREAT RATIO: 1.2 mg/g (ref 0.0–30.0)
Microalb, Ur: 2.9 mg/dL — ABNORMAL HIGH (ref 0.0–1.9)

## 2014-04-01 LAB — COMPREHENSIVE METABOLIC PANEL
ALBUMIN: 4.4 g/dL (ref 3.5–5.2)
ALT: 25 U/L (ref 0–35)
AST: 36 U/L (ref 0–37)
Alkaline Phosphatase: 83 U/L (ref 39–117)
BUN: 27 mg/dL — ABNORMAL HIGH (ref 6–23)
CALCIUM: 9.7 mg/dL (ref 8.4–10.5)
CO2: 24 mEq/L (ref 19–32)
Chloride: 104 mEq/L (ref 96–112)
Creatinine, Ser: 1.3 mg/dL — ABNORMAL HIGH (ref 0.4–1.2)
GFR: 41.87 mL/min — ABNORMAL LOW (ref >60.00)
Glucose, Bld: 107 mg/dL — ABNORMAL HIGH (ref 70–99)
POTASSIUM: 4.6 meq/L (ref 3.5–5.1)
SODIUM: 137 meq/L (ref 135–145)
TOTAL PROTEIN: 7.6 g/dL (ref 6.0–8.3)
Total Bilirubin: 0.9 mg/dL (ref 0.2–1.2)

## 2014-04-01 LAB — LIPID PANEL
Cholesterol: 207 mg/dL — ABNORMAL HIGH (ref 0–200)
HDL: 34 mg/dL — ABNORMAL LOW (ref >39.00)
LDL Cholesterol: 137 mg/dL — ABNORMAL HIGH (ref 0–99)
NonHDL: 173
Total CHOL/HDL Ratio: 6
Triglycerides: 178 mg/dL — ABNORMAL HIGH (ref 0.0–149.0)
VLDL: 35.6 mg/dL (ref 0.0–40.0)

## 2014-04-01 LAB — HEMOGLOBIN A1C: HEMOGLOBIN A1C: 6.6 % — AB (ref 4.6–6.5)

## 2014-04-01 NOTE — Progress Notes (Signed)
Patient ID: Pamela Hanna, female   DOB: 12/08/1924, 80 y.o.   MRN: [id number]   Patient Active Problem List   Diagnosis Date Noted  . Edema 04/04/2014  . Hyperkalemia 12/31/2013  . Rash and nonspecific skin eruption 12/18/2013  . Loss of balance 12/18/2013  . Fatty liver disease, nonalcoholic 12/75/1700  . History of breast cancer 04/29/2012  . Hypertension 01/16/2012  . GERD (gastroesophageal reflux disease) 01/16/2012  . Unspecified hypothyroidism 01/16/2012  . Hyperlipidemia   . Type II or unspecified type diabetes mellitus without mention of complication, not stated as uncontrolled   . Osteoporosis, post-menopausal     Subjective:  CC:   Chief Complaint  Patient presents with  . Follow-up  . Diabetes    HPI:   Vincenta M Schake is a 80 y.o. female who presents for 3 month follow up on HTN, DM, NASH, hypothyroidism, and leg rash.  Last seen 3 months ago at which time onglyza was increased to 5 mg daily for a1c of 7.6 .  Has tolerated change in medication without hypoglycemic epsiodes.   The leg rash has resolved.   Dr Koleen Nimrod looked at it again and told her to resume the  Cream he had prescribed previously, so she did and it resolved.    Taking diuretic daily now for edema.  taking aleve twice daily  For knee pain  And tylenol prn    Past Medical History  Diagnosis Date  . Breast Cancer 1981    ledt mastectomy  . Diabetes mellitus   . GERD (gastroesophageal reflux disease)   . Osteoporosis, post-menopausal     managedwith bisphosphonates since 2005 with no change  . Hypertension   . Hyperlipidemia     Past Surgical History  Procedure Laterality Date  . Abdominal hysterectomy  1969    menorrhagia  . Cesarean section    . Tumor removal  2006    thoracic  back , benign  . Breast surgery  1981    left radical mastectomy with implant       The following portions of the patient's history were reviewed and updated as appropriate: Allergies, current medications, and  problem list.    Review of Systems:   Patient denies headache, fevers, malaise, unintentional weight loss, skin rash, eye pain, sinus congestion and sinus pain, sore throat, dysphagia,  hemoptysis , cough, dyspnea, wheezing, chest pain, palpitations, orthopnea, edema, abdominal pain, nausea, melena, diarrhea, constipation, flank pain, dysuria, hematuria, urinary  Frequency, nocturia, numbness, tingling, seizures,  Focal weakness, Loss of consciousness,  Tremor, insomnia, depression, anxiety, and suicidal ideation.     History   Social History  . Marital Status: Unknown    Spouse Name: N/A    Number of Children: N/A  . Years of Education: N/A   Occupational History  . Not on file.   Social History Main Topics  . Smoking status: Never Smoker   . Smokeless tobacco: Never Used  . Alcohol Use: No  . Drug Use: No  . Sexual Activity: Not on file   Other Topics Concern  . Not on file   Social History Narrative  . No narrative on file    Objective:  Filed Vitals:   04/01/14 0948  BP: 148/60  Pulse: 59  Temp: 97.6 F (36.4 C)  Resp: 16     General appearance: alert, cooperative and appears stated age Ears: normal TM's and external ear canals both ears Throat: lips, mucosa, and tongue normal; teeth and gums normal  Neck: no adenopathy, no carotid bruit, supple, symmetrical, trachea midline and thyroid not enlarged, symmetric, no tenderness/mass/nodules Back: symmetric, no curvature. ROM normal. No CVA tenderness. Lungs: clear to auscultation bilaterally Heart: regular rate and rhythm, S1, S2 normal, no murmur, click, rub or gallop Abdomen: soft, non-tender; bowel sounds normal; no masses,  no organomegaly Pulses: 2+ and symmetric Skin: Skin color, texture, turgor normal. No rashes or lesions  Nonpitting edema noted bilaterally Lymph nodes: Cervical, supraclavicular, and axillary nodes normal.  Assessment and Plan:  Fatty liver disease, nonalcoholic Hepatic enzymes are  normalized and she has no signs of cirrhosis or synthetic dysfunction .  continue low glycemic index diet, weight loss with goal BMI < 30 , which she has done, and management of diabetes and hyperlipidemia  Type II or unspecified type diabetes mellitus without mention of complication, not stated as uncontrolled Improving control with low GI diet, medications and wt loss/exercise .  hemoglobin A1c < 7.0. Patient is referred for  eye exam and foot exam was done today.  There is mild proteinuria on micro urinalysis .  Fasting lipids will be repeated  and statin therapy advised if indicated by application of new ACC guidelines based on patient's 10 year risk of CAD.  Lab Results  Component Value Date   HGBA1C 6.6* 04/01/2014   Lab Results  Component Value Date   MICROALBUR 2.9* 04/01/2014       Hyperlipidemia Improved on current statin therapy.   Liver enzymes are stable  , no changes today.  Lab Results  Component Value Date   CHOL 207* 04/01/2014   HDL 34.00* 04/01/2014   LDLCALC 137* 04/01/2014   LDLDIRECT 149.0 06/10/2013   TRIG 178.0* 04/01/2014   CHOLHDL 6 04/01/2014   . Lab Results  Component Value Date   ALT 25 04/01/2014   AST 36 04/01/2014   ALKPHOS 83 04/01/2014   BILITOT 0.9 04/01/2014       Rash and nonspecific skin eruption Resolved with steroid cream prescribed  by Dr. Koleen Nimrod  Loss of balance Chronic,  With no  Recent falls. Her neurologic exam is normal.  Symptoms are mild  She is able to rise from a seated position unassisted.  Recommended participation in a [place] program to improve balance and strength of core muscles.     Hyperkalemia Resolved by repeat labs.  Lab Results  Component Value Date   NA 137 04/01/2014   K 4.6 04/01/2014   CL 104 04/01/2014   CO2 24 04/01/2014     Hypertension Well controlled on current regimen. Renal function stable, no changes today.  Lab Results  Component Value Date   CREATININE 1.3* 04/01/2014      Edema Noted on exam today.  May be due to aleve.  Asked to supsend it for 2 weeks and use furosemide prn.   A total of 40 minutes was spent with patient more than half of which was spent in counseling patient on the above mentioned issues , reviewing and explaining recent labs and imaging studies done, and coordination of care.   Updated Medication List Outpatient Encounter Prescriptions as of 04/01/2014  Medication Sig  . amLODipine (NORVASC) 5 MG tablet Take 1 tablet (5 mg total) by mouth daily.  Marland Kitchen aspirin 81 MG tablet Take 81 mg by mouth daily.  Marland Kitchen atorvastatin (LIPITOR) 20 MG tablet TAKE ONE (1) TABLET BY MOUTH EVERY DAY  . Calcium Carbonate-Vitamin D (CALCIUM + D) 600-200 MG-UNIT TABS Take by mouth.  Marland Kitchen DILT-XR  240 MG 24 hr capsule TAKE ONE CAPSULE BY MOUTH DAILY  . DIOVAN 320 MG tablet TAKE ONE (1) TABLET EACH DAY  . fish oil-omega-3 fatty acids 1000 MG capsule Take 2 g by mouth daily.  . furosemide (LASIX) 20 MG tablet Take 1 tablet (20 mg total) by mouth daily. As needed for fluid retention  . glimepiride (AMARYL) 2 MG tablet TAKE ONE (1) TABLET BY MOUTH TWO (2) TIMES DAILY  . glucose blood (BAYER CONTOUR NEXT TEST) test strip Use as instructed  . metFORMIN (GLUCOPHAGE) 500 MG tablet TAKE ONE (1) TABLET BY MOUTH TWO (2) TIMES DAILY WITH MEAL  . omeprazole (PRILOSEC) 20 MG capsule TAKE ONE CAPSULE BY MOUTH DAILY  . saxagliptin HCl (ONGLYZA) 5 MG TABS tablet TAKE ONE (1) TABLET EACH DAY  . SYNTHROID 75 MCG tablet TAKE ONE (1) TABLET BY MOUTH EVERY DAY  . triamcinolone cream (KENALOG) 0.1 %   . [DISCONTINUED] acyclovir (ZOVIRAX) 400 MG tablet Take 1 tablet (400 mg total) by mouth 5 (five) times daily.  . [DISCONTINUED] ranitidine (ZANTAC) 75 MG tablet Take 75 mg by mouth 2 (two) times daily.     No orders of the defined types were placed in this encounter.    No Follow-up on file.

## 2014-04-01 NOTE — Progress Notes (Signed)
Pre-visit discussion using our clinic review tool. No additional management support is needed unless otherwise documented below in the visit note.  

## 2014-04-01 NOTE — Patient Instructions (Signed)
As an experiment :  Stop the aleve /motrin for one week  And take 100 \\0  mg tylenol twice daily for your knee pain    If the fluid retention in your leg improves during that week,  Then we know  That the  aleve is adding to your fluid retention   Only use your furosemide if needed, not daily.. Because it can shut your kidneys down  Your blood sugars are much better and your weight continues to improve!

## 2014-04-04 ENCOUNTER — Encounter: Payer: Self-pay | Admitting: Internal Medicine

## 2014-04-04 DIAGNOSIS — R609 Edema, unspecified: Secondary | ICD-10-CM | POA: Insufficient documentation

## 2014-04-04 NOTE — Assessment & Plan Note (Signed)
Well controlled on current regimen. Renal function stable, no changes today.  Lab Results  Component Value Date   CREATININE 1.3* 04/01/2014

## 2014-04-04 NOTE — Assessment & Plan Note (Signed)
Chronic,  With no  Recent falls. Her neurologic exam is normal.  Symptoms are mild  She is able to rise from a seated position unassisted.  Recommended participation in a [place] program to improve balance and strength of core muscles.

## 2014-04-04 NOTE — Assessment & Plan Note (Signed)
Hepatic enzymes are normalized and she has no signs of cirrhosis or synthetic dysfunction .  continue low glycemic index diet, weight loss with goal BMI < 30 , which she has done, and management of diabetes and hyperlipidemia

## 2014-04-04 NOTE — Assessment & Plan Note (Addendum)
Resolved with steroid cream prescribed  by Dr. Koleen Nimrod

## 2014-04-04 NOTE — Assessment & Plan Note (Signed)
Improving control with low GI diet, medications and wt loss/exercise .  hemoglobin A1c < 7.0. Patient is referred for  eye exam and foot exam was done today.  There is mild proteinuria on micro urinalysis .  Fasting lipids will be repeated  and statin therapy advised if indicated by application of new ACC guidelines based on patient's 10 year risk of CAD.  Lab Results  Component Value Date   HGBA1C 6.6* 04/01/2014   Lab Results  Component Value Date   MICROALBUR 2.9* 04/01/2014

## 2014-04-04 NOTE — Assessment & Plan Note (Signed)
Resolved by repeat labs.  Lab Results  Component Value Date   NA 137 04/01/2014   K 4.6 04/01/2014   CL 104 04/01/2014   CO2 24 04/01/2014

## 2014-04-04 NOTE — Assessment & Plan Note (Signed)
Improved on current statin therapy.   Liver enzymes are stable  , no changes today.  Lab Results  Component Value Date   CHOL 207* 04/01/2014   HDL 34.00* 04/01/2014   LDLCALC 137* 04/01/2014   LDLDIRECT 149.0 06/10/2013   TRIG 178.0* 04/01/2014   CHOLHDL 6 04/01/2014   . Lab Results  Component Value Date   ALT 25 04/01/2014   AST 36 04/01/2014   ALKPHOS 83 04/01/2014   BILITOT 0.9 04/01/2014

## 2014-04-04 NOTE — Assessment & Plan Note (Signed)
Noted on exam today.  May be due to aleve.  Asked to supsend it for 2 weeks and use furosemide prn.

## 2014-05-09 ENCOUNTER — Other Ambulatory Visit: Payer: Self-pay | Admitting: Internal Medicine

## 2014-05-30 ENCOUNTER — Other Ambulatory Visit: Payer: Self-pay | Admitting: Internal Medicine

## 2014-06-01 ENCOUNTER — Ambulatory Visit: Payer: Self-pay | Admitting: Internal Medicine

## 2014-06-01 LAB — HM MAMMOGRAPHY: HM Mammogram: NEGATIVE

## 2014-06-02 ENCOUNTER — Encounter: Payer: Self-pay | Admitting: *Deleted

## 2014-06-18 ENCOUNTER — Encounter: Payer: Self-pay | Admitting: Internal Medicine

## 2014-07-18 ENCOUNTER — Other Ambulatory Visit: Payer: Self-pay | Admitting: Internal Medicine

## 2014-07-22 ENCOUNTER — Encounter: Payer: Self-pay | Admitting: Internal Medicine

## 2014-07-22 ENCOUNTER — Ambulatory Visit (INDEPENDENT_AMBULATORY_CARE_PROVIDER_SITE_OTHER): Payer: Medicare Other | Admitting: Internal Medicine

## 2014-07-22 VITALS — BP 126/68 | HR 63 | Temp 98.5°F | Resp 16 | Ht 64.0 in | Wt 153.0 lb

## 2014-07-22 DIAGNOSIS — E875 Hyperkalemia: Secondary | ICD-10-CM

## 2014-07-22 DIAGNOSIS — E119 Type 2 diabetes mellitus without complications: Secondary | ICD-10-CM

## 2014-07-22 DIAGNOSIS — E039 Hypothyroidism, unspecified: Secondary | ICD-10-CM

## 2014-07-22 DIAGNOSIS — K760 Fatty (change of) liver, not elsewhere classified: Secondary | ICD-10-CM

## 2014-07-22 DIAGNOSIS — M810 Age-related osteoporosis without current pathological fracture: Secondary | ICD-10-CM

## 2014-07-22 DIAGNOSIS — I10 Essential (primary) hypertension: Secondary | ICD-10-CM

## 2014-07-22 DIAGNOSIS — R609 Edema, unspecified: Secondary | ICD-10-CM

## 2014-07-22 DIAGNOSIS — E785 Hyperlipidemia, unspecified: Secondary | ICD-10-CM

## 2014-07-22 LAB — LIPID PANEL
CHOL/HDL RATIO: 6
Cholesterol: 197 mg/dL (ref 0–200)
HDL: 33.3 mg/dL — ABNORMAL LOW (ref >39.00)
LDL Cholesterol: 129 mg/dL — ABNORMAL HIGH (ref 0–99)
NONHDL: 163.7
TRIGLYCERIDES: 174 mg/dL — AB (ref 0.0–149.0)
VLDL: 34.8 mg/dL (ref 0.0–40.0)

## 2014-07-22 LAB — COMPREHENSIVE METABOLIC PANEL
ALT: 31 U/L (ref 0–35)
AST: 53 U/L — ABNORMAL HIGH (ref 0–37)
Albumin: 4.6 g/dL (ref 3.5–5.2)
Alkaline Phosphatase: 89 U/L (ref 39–117)
BUN: 31 mg/dL — ABNORMAL HIGH (ref 6–23)
CALCIUM: 9.9 mg/dL (ref 8.4–10.5)
CO2: 20 mEq/L (ref 19–32)
CREATININE: 1.3 mg/dL — AB (ref 0.4–1.2)
Chloride: 112 mEq/L (ref 96–112)
GFR: 40.74 mL/min — AB (ref >60.00)
Glucose, Bld: 104 mg/dL — ABNORMAL HIGH (ref 70–99)
Potassium: 5.7 mEq/L — ABNORMAL HIGH (ref 3.5–5.1)
Sodium: 146 mEq/L — ABNORMAL HIGH (ref 135–145)
TOTAL PROTEIN: 7.5 g/dL (ref 6.0–8.3)
Total Bilirubin: 0.7 mg/dL (ref 0.2–1.2)

## 2014-07-22 LAB — HEMOGLOBIN A1C: Hgb A1c MFr Bld: 7 % — ABNORMAL HIGH (ref 4.6–6.5)

## 2014-07-22 LAB — TSH: TSH: 2.92 u[IU]/mL (ref 0.35–4.50)

## 2014-07-22 MED ORDER — SITAGLIPTIN PHOSPHATE 100 MG PO TABS: 100.0000 mg | ORAL_TABLET | Freq: Every day | ORAL | Status: DC

## 2014-07-22 MED ORDER — VALSARTAN 160 MG PO TABS: 160.0000 mg | ORAL_TABLET | Freq: Every day | ORAL | Status: DC

## 2014-07-22 NOTE — Patient Instructions (Addendum)
Stop the Diltiazem. It is causing your fluid retention  If your BP goes up after stopping it,  Call me with the readings.    WE are going to [place] TO VALSARTAN (same ,, just generic version ) and  AND ONGLYZA TO JANUVIA (due to insurance coverage) in 2016  If your labs look good, I'll see you in 6 months

## 2014-07-22 NOTE — Progress Notes (Signed)
Pre visit review using our clinic review tool, if applicable. No additional management support is needed unless otherwise documented below in the visit note. 

## 2014-07-22 NOTE — Assessment & Plan Note (Addendum)
Improving control with low GI diet, medications and wt loss/exercise .  hemoglobin A1c is now  7.0. Patient is up to date on annual   eye exam and foot exam was done at last visit .  There is mild proteinuria on micro urinalysis . She is tolerating atorvastatin and diovan .  Onglyza will not be covered in 2016,  changng to januvia 100 mg .  Lab Results  Component Value Date   HGBA1C 7.0* 07/22/2014   Lab Results  Component Value Date   MICROALBUR 2.9* 04/01/2014     Lab Results  Component Value Date   CHOL 197 07/22/2014   HDL 33.30* 07/22/2014   LDLCALC 129* 07/22/2014   LDLDIRECT 149.0 06/10/2013   TRIG 174.0* 07/22/2014   CHOLHDL 6 07/22/2014

## 2014-07-22 NOTE — Assessment & Plan Note (Signed)
Thyroid function was WNL on current dose.  No current changes unless TSH is not WNL today  Lab Results  Component Value Date   TSH 4.27 09/17/2013

## 2014-07-22 NOTE — Progress Notes (Signed)
Patient ID: Pamela Hanna, female   DOB: 03/27/1925, 80 y.o.   MRN: [id number]  Patient Active Problem List   Diagnosis Date Noted  . Edema 04/04/2014  . Hyperkalemia 12/31/2013  . Rash and nonspecific skin eruption 12/18/2013  . Loss of balance 12/18/2013  . Fatty liver disease, nonalcoholic 38/75/6433  . History of breast cancer 04/29/2012  . Hypertension 01/16/2012  . GERD (gastroesophageal reflux disease) 01/16/2012  . Hypothyroidism 01/16/2012  . Hyperlipidemia   . Diabetes mellitus without complication   . Osteoporosis, post-menopausal     Subjective:  CC:   Chief Complaint  Patient presents with  . DM follow up    HPI:   Pamela Hanna is a 80 y.o. female who presents for Follow up on DM, type 2,  Fatty liver,  And hypertension,    Her BS have been well controlled.  She is taking her medicattions as directed and denies adverse effects. She is following a carbohydrate modifed diet 75% of the time,  And exercising only  Minimally but is physically active on a daily basis . She denies any recent hypoglycemic events.  Denies any symptoms of neuropathy and excessive urination.     Past Medical History  Diagnosis Date  . Breast Cancer 1981    ledt mastectomy  . Diabetes mellitus   . GERD (gastroesophageal reflux disease)   . Osteoporosis, post-menopausal     managedwith bisphosphonates since 2005 with no change  . Hypertension   . Hyperlipidemia     Past Surgical History  Procedure Laterality Date  . Abdominal hysterectomy  1969    menorrhagia  . Cesarean section    . Tumor removal  2006    thoracic  back , benign  . Breast surgery  1981    left radical mastectomy with implant       The following portions of the patient's history were reviewed and updated as appropriate: Allergies, current medications, and problem list.    Review of Systems:   Patient denies headache, fevers, malaise, unintentional weight loss, skin rash, eye pain, sinus congestion  and sinus pain, sore throat, dysphagia,  hemoptysis , cough, dyspnea, wheezing, chest pain, palpitations, orthopnea, edema, abdominal pain, nausea, melena, diarrhea, constipation, flank pain, dysuria, hematuria, urinary  Frequency, nocturia, numbness, tingling, seizures,  Focal weakness, Loss of consciousness,  Tremor, insomnia, depression, anxiety, and suicidal ideation.     History   Social History  . Marital Status: Widowed    Spouse Name: N/A    Number of Children: N/A  . Years of Education: N/A   Occupational History  . Not on file.   Social History Main Topics  . Smoking status: Never Smoker   . Smokeless tobacco: Never Used  . Alcohol Use: No  . Drug Use: No  . Sexual Activity: Not on file   Other Topics Concern  . Not on file   Social History Narrative    Objective:  Filed Vitals:   07/22/14 1026  BP: 126/68  Pulse: 63  Temp: 98.5 F (36.9 C)  Resp: 16     General appearance: alert, cooperative and appears stated age Ears: normal TM's and external ear canals both ears Throat: lips, mucosa, and tongue normal; teeth and gums normal Neck: no adenopathy, no carotid bruit, supple, symmetrical, trachea midline and thyroid not enlarged, symmetric, no tenderness/mass/nodules Back: symmetric, no curvature. ROM normal. No CVA tenderness. Lungs: clear to auscultation bilaterally Heart: regular rate and rhythm, S1, S2 normal, no murmur, click,  rub or gallop Abdomen: soft, non-tender; bowel sounds normal; no masses,  no organomegaly Pulses: 2+ and symmetric Skin: Skin color, texture, turgor normal. No rashes or lesions Lymph nodes: Cervical, supraclavicular, and axillary nodes normal.  Assessment and Plan:  Hypertension Well controlled on current regimen, however shee is having increased LE edema bilaterally . Stopping diltiazem today given increased LE edema, and valsartan due to persistent hyperkalemia. .  Continue amlodipine 5 mg   Repeat BMET in several days to  follow up on elevated potassium and sodium levels.   Lab Results  Component Value Date   CREATININE 1.3* 07/22/2014    Lab Results  Component Value Date   NA 146* 07/22/2014   K 5.7* 07/22/2014   CL 112 07/22/2014   CO2 20 07/22/2014      Hypothyroidism Thyroid function was WNL on current dose.  No current changes unless TSH is not WNL today  Lab Results  Component Value Date   TSH 4.27 09/17/2013     Diabetes mellitus without complication Improving control with low GI diet, medications and wt loss/exercise .  hemoglobin A1c is now  7.0. Patient is up to date on annual   eye exam and foot exam was done at last visit .  There is mild proteinuria on micro urinalysis . She is tolerating atorvastatin and diovan .  Onglyza will not be covered in 2016,  changng to januvia 100 mg .  Lab Results  Component Value Date   HGBA1C 7.0* 07/22/2014   Lab Results  Component Value Date   MICROALBUR 2.9* 04/01/2014     Lab Results  Component Value Date   CHOL 197 07/22/2014   HDL 33.30* 07/22/2014   LDLCALC 129* 07/22/2014   LDLDIRECT 149.0 06/10/2013   TRIG 174.0* 07/22/2014   CHOLHDL 6 07/22/2014       Fatty liver disease, nonalcoholic Presumed by U/s and negative serologies ofr autoimmunes causes of hepatitis . Sept 2013.  Hepatic enzymes are stable and she has no signs of cirrhosis or synthetic dysfunction .  continue low glycemic index diet, weight loss with goal BMI < 30 , which she has done, and management of diabetes and hyperlipidemia.  Will advise her to have the Hepatitis A and B vaccines at next visit,   Lab Results  Component Value Date   ALT 31 07/22/2014   AST 53* 07/22/2014   ALKPHOS 89 07/22/2014   BILITOT 0.7 07/22/2014      Hyperkalemia Persistent,  Will need to stop valsartan and repeat BMET in one week.   Lab Results  Component Value Date   NA 146* 07/22/2014   K 5.7* 07/22/2014   CL 112 07/22/2014   CO2 20 07/22/2014     Edema Likely  secondary to combined use of ca channel blockers.  Stopping diltiazem.  Hyperlipidemia LDL and triglycerides are not at goal on current medications. SHe has no side effects and liver enzymes are stable ,  Will increase dose of statin today .  Lab Results  Component Value Date   CHOL 197 07/22/2014   HDL 33.30* 07/22/2014   LDLCALC 129* 07/22/2014   LDLDIRECT 149.0 06/10/2013   TRIG 174.0* 07/22/2014   CHOLHDL 6 07/22/2014   Lab Results  Component Value Date   ALT 31 07/22/2014   AST 53* 07/22/2014   ALKPHOS 89 07/22/2014   BILITOT 0.7 07/22/2014      Updated Medication List Outpatient Encounter Prescriptions as of 07/22/2014  Medication Sig  . amLODipine (NORVASC)  5 MG tablet TAKE ONE (1) TABLET EACH DAY  . aspirin 81 MG tablet Take 81 mg by mouth daily.  Marland Kitchen atorvastatin (LIPITOR) 40 MG tablet TAKE ONE (1) TABLET BY MOUTH EVERY DAY  . Calcium Carbonate-Vitamin D (CALCIUM + D) 600-200 MG-UNIT TABS Take by mouth.  . fish oil-omega-3 fatty acids 1000 MG capsule Take 2 g by mouth daily.  . furosemide (LASIX) 20 MG tablet Take 1 tablet (20 mg total) by mouth daily. As needed for fluid retention  . glimepiride (AMARYL) 2 MG tablet TAKE ONE (1) TABLET BY MOUTH TWO (2) TIMES DAILY  . glucose blood (BAYER CONTOUR NEXT TEST) test strip Use as instructed  . metFORMIN (GLUCOPHAGE) 500 MG tablet TAKE ONE (1) TABLET BY MOUTH TWO (2) TIMES DAILY WITH MEAL  . omeprazole (PRILOSEC) 20 MG capsule TAKE ONE CAPSULE BY MOUTH DAILY  . SYNTHROID 75 MCG tablet TAKE ONE (1) TABLET BY MOUTH EVERY DAY  . triamcinolone cream (KENALOG) 0.1 %   . [DISCONTINUED] atorvastatin (LIPITOR) 20 MG tablet TAKE ONE (1) TABLET BY MOUTH EVERY DAY  . [DISCONTINUED] DILT-XR 240 MG 24 hr capsule TAKE ONE (1) CAPSULE EACH DAY  . [DISCONTINUED] DIOVAN 320 MG tablet TAKE ONE (1) TABLET EACH DAY  . [DISCONTINUED] saxagliptin HCl (ONGLYZA) 5 MG TABS tablet TAKE ONE (1) TABLET EACH DAY  . sitaGLIPtin (JANUVIA) 100 MG tablet  Take 1 tablet (100 mg total) by mouth daily.  . [DISCONTINUED] valsartan (DIOVAN) 160 MG tablet Take 1 tablet (160 mg total) by mouth daily.     Orders Placed This Encounter  Procedures  . Comprehensive metabolic panel  . Hemoglobin A1c  . Lipid panel  . TSH  . Basic metabolic panel  . HM DIABETES EYE EXAM    Return in about 6 months (around 01/21/2015) for follow up diabetes.

## 2014-07-22 NOTE — Assessment & Plan Note (Addendum)
Well controlled on current regimen, however shee is having increased LE edema bilaterally . Stopping diltiazem today given increased LE edema, and valsartan due to persistent hyperkalemia. .  Continue amlodipine 5 mg   Repeat BMET in several days to follow up on elevated potassium and sodium levels.   Lab Results  Component Value Date   CREATININE 1.3* 07/22/2014    Lab Results  Component Value Date   NA 146* 07/22/2014   K 5.7* 07/22/2014   CL 112 07/22/2014   CO2 20 07/22/2014

## 2014-07-23 ENCOUNTER — Telehealth: Payer: Self-pay | Admitting: Internal Medicine

## 2014-07-23 NOTE — Telephone Encounter (Signed)
emmi emailed °

## 2014-07-25 ENCOUNTER — Encounter: Payer: Self-pay | Admitting: Internal Medicine

## 2014-07-25 MED ORDER — ATORVASTATIN CALCIUM 40 MG PO TABS: ORAL_TABLET | ORAL | Status: DC

## 2014-07-25 NOTE — Assessment & Plan Note (Signed)
Likely secondary to combined use of ca channel blockers.  Stopping diltiazem.

## 2014-07-25 NOTE — Assessment & Plan Note (Signed)
LDL and triglycerides are not at goal on current medications. SHe has no side effects and liver enzymes are stable ,  Will increase dose of statin today .  Lab Results  Component Value Date   CHOL 197 07/22/2014   HDL 33.30* 07/22/2014   LDLCALC 129* 07/22/2014   LDLDIRECT 149.0 06/10/2013   TRIG 174.0* 07/22/2014   CHOLHDL 6 07/22/2014   Lab Results  Component Value Date   ALT 31 07/22/2014   AST 53* 07/22/2014   ALKPHOS 89 07/22/2014   BILITOT 0.7 07/22/2014

## 2014-07-25 NOTE — Assessment & Plan Note (Addendum)
Presumed by U/s and negative serologies ofr autoimmunes causes of hepatitis . Sept 2013.  Hepatic enzymes are stable and she has no signs of cirrhosis or synthetic dysfunction .  continue low glycemic index diet, weight loss with goal BMI < 30 , which she has done, and management of diabetes and hyperlipidemia.  Will advise her to have the Hepatitis A and B vaccines at next visit,   Lab Results  Component Value Date   ALT 31 07/22/2014   AST 53* 07/22/2014   ALKPHOS 89 07/22/2014   BILITOT 0.7 07/22/2014

## 2014-07-25 NOTE — Assessment & Plan Note (Signed)
Persistent,  Will need to stop valsartan and repeat BMET in one week.   Lab Results  Component Value Date   NA 146* 07/22/2014   K 5.7* 07/22/2014   CL 112 07/22/2014   CO2 20 07/22/2014

## 2014-08-08 ENCOUNTER — Other Ambulatory Visit: Payer: Self-pay | Admitting: Internal Medicine

## 2014-08-08 NOTE — Telephone Encounter (Signed)
Per last office note, this medication was discontinued. Please advise If okay to refill.

## 2014-08-10 MED ORDER — SITAGLIPTIN PHOSPHATE 100 MG PO TABS: 100.0000 mg | ORAL_TABLET | Freq: Every day | ORAL | Status: DC

## 2014-08-10 NOTE — Telephone Encounter (Signed)
Faxed to pharmacy

## 2014-08-10 NOTE — Telephone Encounter (Signed)
Patient was trying to refill because she had lost script for Januvia 100 mg, have printed script for signature.

## 2014-08-11 ENCOUNTER — Other Ambulatory Visit (INDEPENDENT_AMBULATORY_CARE_PROVIDER_SITE_OTHER): Payer: Medicare Other

## 2014-08-11 DIAGNOSIS — E875 Hyperkalemia: Secondary | ICD-10-CM

## 2014-08-11 LAB — BASIC METABOLIC PANEL
BUN: 27 mg/dL — AB (ref 6–23)
CALCIUM: 9.8 mg/dL (ref 8.4–10.5)
CO2: 25 mEq/L (ref 19–32)
Chloride: 109 mEq/L (ref 96–112)
Creatinine, Ser: 1 mg/dL (ref 0.4–1.2)
GFR: 58.31 mL/min — AB (ref >60.00)
Glucose, Bld: 90 mg/dL (ref 70–99)
POTASSIUM: 4.8 meq/L (ref 3.5–5.1)
Sodium: 140 mEq/L (ref 135–145)

## 2014-08-11 MED ORDER — SITAGLIPTIN PHOSPHATE 100 MG PO TABS: 100.0000 mg | ORAL_TABLET | Freq: Every day | ORAL | Status: DC

## 2014-08-11 NOTE — Telephone Encounter (Signed)
Correct,  Formulary change necessitated change from onglyza to [place] for 2016.

## 2014-08-11 NOTE — Telephone Encounter (Signed)
patient notified and script sent.

## 2014-08-11 NOTE — Addendum Note (Signed)
Addended by: Nanci Pina on: 08/11/2014 08:42 AM   Modules accepted: Orders

## 2014-08-12 ENCOUNTER — Encounter: Payer: Self-pay | Admitting: Internal Medicine

## 2014-08-15 ENCOUNTER — Other Ambulatory Visit: Payer: Self-pay | Admitting: Internal Medicine

## 2014-11-02 ENCOUNTER — Other Ambulatory Visit: Payer: Self-pay | Admitting: Internal Medicine

## 2014-11-07 ENCOUNTER — Other Ambulatory Visit: Payer: Self-pay | Admitting: Internal Medicine

## 2014-11-27 LAB — HM DIABETES EYE EXAM

## 2014-11-28 ENCOUNTER — Other Ambulatory Visit: Payer: Self-pay | Admitting: Internal Medicine

## 2014-11-30 ENCOUNTER — Encounter: Payer: Self-pay | Admitting: *Deleted

## 2014-12-02 ENCOUNTER — Other Ambulatory Visit: Payer: Self-pay | Admitting: Internal Medicine

## 2015-01-18 ENCOUNTER — Other Ambulatory Visit (INDEPENDENT_AMBULATORY_CARE_PROVIDER_SITE_OTHER): Payer: Medicare Other

## 2015-01-18 ENCOUNTER — Telehealth: Payer: Self-pay | Admitting: *Deleted

## 2015-01-18 DIAGNOSIS — E032 Hypothyroidism due to medicaments and other exogenous substances: Secondary | ICD-10-CM

## 2015-01-18 DIAGNOSIS — E119 Type 2 diabetes mellitus without complications: Secondary | ICD-10-CM | POA: Diagnosis not present

## 2015-01-18 LAB — LIPID PANEL
Cholesterol: 177 mg/dL (ref 0–200)
HDL: 31.8 mg/dL — ABNORMAL LOW (ref >39.00)
LDL Cholesterol: 113 mg/dL — ABNORMAL HIGH (ref 0–99)
NONHDL: 145.2
Total CHOL/HDL Ratio: 6
Triglycerides: 161 mg/dL — ABNORMAL HIGH (ref 0.0–149.0)
VLDL: 32.2 mg/dL (ref 0.0–40.0)

## 2015-01-18 LAB — COMPREHENSIVE METABOLIC PANEL
ALT: 27 U/L (ref 0–35)
AST: 39 U/L — ABNORMAL HIGH (ref 0–37)
Albumin: 4.3 g/dL (ref 3.5–5.2)
Alkaline Phosphatase: 84 U/L (ref 39–117)
BILIRUBIN TOTAL: 0.8 mg/dL (ref 0.2–1.2)
BUN: 21 mg/dL (ref 6–23)
CALCIUM: 9.9 mg/dL (ref 8.4–10.5)
CO2: 25 mEq/L (ref 19–32)
CREATININE: 0.97 mg/dL (ref 0.40–1.20)
Chloride: 107 mEq/L (ref 96–112)
GFR: 57.56 mL/min — AB (ref >60.00)
GLUCOSE: 95 mg/dL (ref 70–99)
Potassium: 5.1 mEq/L (ref 3.5–5.1)
SODIUM: 139 meq/L (ref 135–145)
Total Protein: 6.7 g/dL (ref 6.0–8.3)

## 2015-01-18 LAB — HEMOGLOBIN A1C: Hgb A1c MFr Bld: 6.9 % — ABNORMAL HIGH (ref 4.6–6.5)

## 2015-01-18 LAB — TSH: TSH: 2.67 u[IU]/mL (ref 0.35–4.50)

## 2015-01-18 NOTE — Telephone Encounter (Signed)
Labs and dx?  

## 2015-01-18 NOTE — Telephone Encounter (Signed)
All entered accidentally as current not future:  CMET a1c microal.cr ratio,  Tsh, and  fasting lipids

## 2015-01-20 ENCOUNTER — Ambulatory Visit: Payer: Medicare Other | Admitting: Internal Medicine

## 2015-01-27 ENCOUNTER — Encounter: Payer: Self-pay | Admitting: Internal Medicine

## 2015-01-27 ENCOUNTER — Ambulatory Visit (INDEPENDENT_AMBULATORY_CARE_PROVIDER_SITE_OTHER): Payer: Medicare Other | Admitting: Internal Medicine

## 2015-01-27 VITALS — BP 136/60 | HR 66 | Temp 97.6°F | Resp 14 | Ht 64.0 in | Wt 150.5 lb

## 2015-01-27 DIAGNOSIS — E875 Hyperkalemia: Secondary | ICD-10-CM

## 2015-01-27 DIAGNOSIS — E119 Type 2 diabetes mellitus without complications: Secondary | ICD-10-CM | POA: Diagnosis not present

## 2015-01-27 DIAGNOSIS — M6281 Muscle weakness (generalized): Secondary | ICD-10-CM

## 2015-01-27 DIAGNOSIS — E038 Other specified hypothyroidism: Secondary | ICD-10-CM

## 2015-01-27 DIAGNOSIS — E785 Hyperlipidemia, unspecified: Secondary | ICD-10-CM

## 2015-01-27 DIAGNOSIS — M6289 Other specified disorders of muscle: Secondary | ICD-10-CM

## 2015-01-27 DIAGNOSIS — I10 Essential (primary) hypertension: Secondary | ICD-10-CM

## 2015-01-27 DIAGNOSIS — E034 Atrophy of thyroid (acquired): Secondary | ICD-10-CM

## 2015-01-27 NOTE — Progress Notes (Signed)
Pre-visit discussion using our clinic review tool. No additional management support is needed unless otherwise documented below in the visit note.  

## 2015-01-27 NOTE — Progress Notes (Signed)
Subjective:  Patient ID: Pamela Hanna, female    DOB: 1925-03-27  Age: 80 y.o. MRN: [id number]  CC: The primary encounter diagnosis was Essential hypertension. Diagnoses of Diabetes mellitus without complication, Hyperlipidemia, Proximal muscle weakness, Hyperkalemia, and Hypothyroidism due to acquired atrophy of thyroid were also pertinent to this visit.  HPI Pamela Hanna presents for follow up  On DM type 2, hyperlipidemia, and hypothyroidism.   She feels generally well, is walking less due to a feeling of off balance and checking blood sugars once daily at variable times.  BS have been under 130 fasting and < 150 post prandially.  Denies any recent hypoglyemic events.  Taking her medications as directed. Following a carbohydrate modified diet 6 days per week. Denies numbness, burning and tingling of extremities. Appetite is good.    She describes feels off balance a lot but has not fallen or rad actual dizzy spells.   proximal muscles  weakness, can't go up a step without holding on to the railing and pulling herself forward with it.  PT referral discussed at [facility]   Outpatient Prescriptions Prior to Visit  Medication Sig Dispense Refill  . amLODipine (NORVASC) 5 MG tablet TAKE ONE (1) TABLET BY MOUTH EVERY DAY 90 tablet 1  . atorvastatin (LIPITOR) 40 MG tablet TAKE ONE (1) TABLET BY MOUTH EVERY DAY 90 tablet 1  . Calcium Carbonate-Vitamin D (CALCIUM + D) 600-200 MG-UNIT TABS Take by mouth.    . fish oil-omega-3 fatty acids 1000 MG capsule Take 2 g by mouth daily.    . furosemide (LASIX) 20 MG tablet Take 1 tablet (20 mg total) by mouth daily. As needed for fluid retention 30 tablet 3  . glimepiride (AMARYL) 2 MG tablet TAKE ONE (1) TABLET BY MOUTH TWO (2) TIMES DAILY 180 tablet 1  . glucose blood (BAYER CONTOUR NEXT TEST) test strip Use as instructed 100 each 12  . metFORMIN (GLUCOPHAGE) 500 MG tablet TAKE ONE TABLET TWICE A DAY WITH FOOD 180 tablet 1  . omeprazole (PRILOSEC) 20 MG  capsule TAKE ONE CAPSULE BY MOUTH DAILY 90 capsule 1  . sitaGLIPtin (JANUVIA) 100 MG tablet Take 1 tablet (100 mg total) by mouth daily. 90 tablet 1  . SYNTHROID 75 MCG tablet TAKE ONE (1) TABLET BY MOUTH EVERY DAY 90 tablet 3  . aspirin 81 MG tablet Take 81 mg by mouth daily.    Marland Kitchen triamcinolone cream (KENALOG) 0.1 %      No facility-administered medications prior to visit.    Review of Systems;  Patient denies headache, fevers, malaise, unintentional weight loss, skin rash, eye pain, sinus congestion and sinus pain, sore throat, dysphagia,  hemoptysis , cough, dyspnea, wheezing, chest pain, palpitations, orthopnea, edema, abdominal pain, nausea, melena, diarrhea, constipation, flank pain, dysuria, hematuria, urinary  Frequency, nocturia, numbness, tingling, seizures,  Focal weakness, Loss of consciousness,  Tremor, insomnia, depression, anxiety, and suicidal ideation.      Objective:  BP 136/60 mmHg  Pulse 66  Temp(Src) 97.6 F (36.4 C) (Oral)  Resp 14  Ht 5\' 4"  (1.626 m)  Wt 150 lb 8 oz (68.266 kg)  BMI 25.82 kg/m2  SpO2 98%  BP Readings from Last 3 Encounters:  01/27/15 136/60  07/22/14 126/68  04/01/14 148/60    Wt Readings from Last 3 Encounters:  01/27/15 150 lb 8 oz (68.266 kg)  07/22/14 153 lb (69.4 kg)  04/01/14 150 lb 4 oz (68.153 kg)    General appearance: alert, cooperative and appears  stated age Ears: normal TM's and external ear canals both ears Back: symmetric, no curvature. ROM normal. No CVA tenderness. Lungs: clear to auscultation bilaterally Heart: regular rate and rhythm, S1, S2 normal, no murmur, click, rub or gallop Abdomen: soft, non-tender; bowel sounds normal; no masses,  no organomegaly Pulses: 2+ and symmetric Skin: Skin color, texture, turgor normal. No rashes or lesions Lymph nodes: Cervical, supraclavicular, and axillary nodes normal. MSK: 0roximal muscle weakness of both legs noted. Strength 4/5. Bilaterally,  No foot drop Foot exam:   Nails are well trimmed,  No callouses,  Sensation intact to microfilament .   Lab Results  Component Value Date   HGBA1C 6.9* 01/18/2015   HGBA1C 7.0* 07/22/2014   HGBA1C 6.6* 04/01/2014    Lab Results  Component Value Date   CREATININE 0.97 01/18/2015   CREATININE 1.0 08/11/2014   CREATININE 1.3* 07/22/2014    Lab Results  Component Value Date   GLUCOSE 95 01/18/2015   CHOL 177 01/18/2015   TRIG 161.0* 01/18/2015   HDL 31.80* 01/18/2015   LDLDIRECT 149.0 06/10/2013   LDLCALC 113* 01/18/2015   ALT 27 01/18/2015   AST 39* 01/18/2015   NA 139 01/18/2015   K 5.1 01/18/2015   CL 107 01/18/2015   CREATININE 0.97 01/18/2015   BUN 21 01/18/2015   CO2 25 01/18/2015   TSH 2.67 01/18/2015   HGBA1C 6.9* 01/18/2015   MICROALBUR 2.9* 04/01/2014    No results found.  Assessment & Plan:   Problem List Items Addressed This Visit    Hypertension - Primary    Well controlled on current regimen. Renal function stable, no changes today.  Lab Results  Component Value Date   CREATININE 0.97 01/18/2015   Lab Results  Component Value Date   NA 139 01/18/2015   K 5.1 01/18/2015   CL 107 01/18/2015   CO2 25 01/18/2015         Relevant Medications   aspirin 81 MG tablet   Hyperlipidemia    Presumed by ultrasound changes and serologies negative for autoimmune causes of hepatitis.  Current liver enzymes are normal and all modifiable risk factors including obesity, diabetes and hyperlipidemia have been addressed       Relevant Medications   aspirin 81 MG tablet   Diabetes mellitus without complication    Improving control with low GI diet, medications and wt loss/exercise .  hemoglobin A1c is now <  7.0. Patient is up to date on annual   eye exam and foot exam was done today .  There is mild proteinuria on micro urinalysis . She is tolerating atorvastatin and diovan .   Tolerating januvia 100 mg .  Lab Results  Component Value Date   HGBA1C 6.9* 01/18/2015   Lab Results    Component Value Date   MICROALBUR 2.9* 04/01/2014     Lab Results  Component Value Date   CHOL 177 01/18/2015   HDL 31.80* 01/18/2015   LDLCALC 113* 01/18/2015   LDLDIRECT 149.0 06/10/2013   TRIG 161.0* 01/18/2015   CHOLHDL 6 01/18/2015             Relevant Medications   aspirin 81 MG tablet   Hypothyroidism    Thyroid function was WNL on current dose.  No current changes today.  Lab Results  Component Value Date   TSH 2.67 01/18/2015           Proximal muscle weakness    Chronic,  With no  Recent falls. Her  neurologic exam is normal.  Symptoms are mild  She is no longer able to rise from a seated position unassisted.  Recommended participation in a [place] program or referral to [facility] PT to improve balance and strength of core muscles.           Hyperkalemia    Secondary to to vaslartan now resolved since DC of medication  Lab Results  Component Value Date   CREATININE 0.97 01/18/2015   Lab Results  Component Value Date   NA 139 01/18/2015   K 5.1 01/18/2015   CL 107 01/18/2015   CO2 25 01/18/2015             I have discontinued Ms. Offutt's triamcinolone cream. I have also changed her aspirin. Additionally, I am having her maintain her fish oil-omega-3 fatty acids, Calcium Carbonate-Vitamin D, glucose blood, furosemide, atorvastatin, sitaGLIPtin, SYNTHROID, omeprazole, glimepiride, metFORMIN, amLODipine, and naproxen sodium.  Meds ordered this encounter  Medications  . naproxen sodium (ANAPROX) 220 MG tablet    Sig: Take 220 mg by mouth 2 (two) times daily with a meal.  . aspirin 81 MG tablet    Sig: Take 1 tablet (81 mg total) by mouth 2 (two) times a week.    Dispense:  30 tablet    Refill:  11    Medications Discontinued During This Encounter  Medication Reason  . triamcinolone cream (KENALOG) 0.1 % Completed Course  . aspirin 81 MG tablet Reorder    Follow-up: Return in about 6 months (around 07/29/2015).   Crecencio Mc, MD

## 2015-01-27 NOTE — Patient Instructions (Addendum)
Your diabetes remains under excellent control  And your cholesterol and other labs are also normal. Please continue your current medications. return in 6 months for follow up on diabetes and make sure you are seeing your eye doctor at least once a year.    Resume your baby aspirin at least twice weekly

## 2015-01-31 MED ORDER — ASPIRIN 81 MG PO TABS: 81.0000 mg | ORAL_TABLET | ORAL | Status: AC

## 2015-01-31 NOTE — Assessment & Plan Note (Signed)
Thyroid function was WNL on current dose.  No current changes today.  Lab Results  Component Value Date   TSH 2.67 01/18/2015

## 2015-01-31 NOTE — Assessment & Plan Note (Signed)
Secondary to to vaslartan now resolved since DC of medication  Lab Results  Component Value Date   CREATININE 0.97 01/18/2015   Lab Results  Component Value Date   NA 139 01/18/2015   K 5.1 01/18/2015   CL 107 01/18/2015   CO2 25 01/18/2015

## 2015-01-31 NOTE — Assessment & Plan Note (Signed)
Well controlled on current regimen. Renal function stable, no changes today.  Lab Results  Component Value Date   CREATININE 0.97 01/18/2015   Lab Results  Component Value Date   NA 139 01/18/2015   K 5.1 01/18/2015   CL 107 01/18/2015   CO2 25 01/18/2015

## 2015-01-31 NOTE — Assessment & Plan Note (Signed)
Improving control with low GI diet, medications and wt loss/exercise .  hemoglobin A1c is now <  7.0. Patient is up to date on annual   eye exam and foot exam was done today .  There is mild proteinuria on micro urinalysis . She is tolerating atorvastatin and diovan .   Tolerating januvia 100 mg .  Lab Results  Component Value Date   HGBA1C 6.9* 01/18/2015   Lab Results  Component Value Date   MICROALBUR 2.9* 04/01/2014     Lab Results  Component Value Date   CHOL 177 01/18/2015   HDL 31.80* 01/18/2015   LDLCALC 113* 01/18/2015   LDLDIRECT 149.0 06/10/2013   TRIG 161.0* 01/18/2015   CHOLHDL 6 01/18/2015

## 2015-01-31 NOTE — Assessment & Plan Note (Signed)
Presumed by ultrasound changes and serologies negative for autoimmune causes of hepatitis.  Current liver enzymes are normal and all modifiable risk factors including obesity, diabetes and hyperlipidemia have been addressed  

## 2015-01-31 NOTE — Assessment & Plan Note (Signed)
Chronic,  With no  Recent falls. Her neurologic exam is normal.  Symptoms are mild  She is no longer able to rise from a seated position unassisted.  Recommended participation in a [place] program or referral to [facility] PT to improve balance and strength of core muscles.

## 2015-02-09 ENCOUNTER — Other Ambulatory Visit: Payer: Self-pay | Admitting: Internal Medicine

## 2015-04-25 ENCOUNTER — Emergency Department: Payer: Medicare Other

## 2015-04-25 ENCOUNTER — Observation Stay
Admission: EM | Admit: 2015-04-25 | Discharge: 2015-04-26 | Disposition: A | Discharge status: Home / Self Care | Payer: Medicare Other | Attending: Internal Medicine | Admitting: Internal Medicine

## 2015-04-25 ENCOUNTER — Encounter: Payer: Self-pay | Admitting: Internal Medicine

## 2015-04-25 DIAGNOSIS — E039 Hypothyroidism, unspecified: Secondary | ICD-10-CM | POA: Insufficient documentation

## 2015-04-25 DIAGNOSIS — R42 Dizziness and giddiness: Secondary | ICD-10-CM | POA: Insufficient documentation

## 2015-04-25 DIAGNOSIS — Z853 Personal history of malignant neoplasm of breast: Secondary | ICD-10-CM | POA: Insufficient documentation

## 2015-04-25 DIAGNOSIS — Z800 Family history of malignant neoplasm of digestive organs: Secondary | ICD-10-CM | POA: Insufficient documentation

## 2015-04-25 DIAGNOSIS — Z801 Family history of malignant neoplasm of trachea, bronchus and lung: Secondary | ICD-10-CM | POA: Insufficient documentation

## 2015-04-25 DIAGNOSIS — M6281 Muscle weakness (generalized): Secondary | ICD-10-CM | POA: Insufficient documentation

## 2015-04-25 DIAGNOSIS — I10 Essential (primary) hypertension: Secondary | ICD-10-CM | POA: Insufficient documentation

## 2015-04-25 DIAGNOSIS — Z888 Allergy status to other drugs, medicaments and biological substances status: Secondary | ICD-10-CM | POA: Diagnosis not present

## 2015-04-25 DIAGNOSIS — Z90710 Acquired absence of both cervix and uterus: Secondary | ICD-10-CM | POA: Insufficient documentation

## 2015-04-25 DIAGNOSIS — R079 Chest pain, unspecified: Principal | ICD-10-CM | POA: Insufficient documentation

## 2015-04-25 DIAGNOSIS — R0602 Shortness of breath: Secondary | ICD-10-CM | POA: Insufficient documentation

## 2015-04-25 DIAGNOSIS — Z794 Long term (current) use of insulin: Secondary | ICD-10-CM | POA: Diagnosis not present

## 2015-04-25 DIAGNOSIS — K760 Fatty (change of) liver, not elsewhere classified: Secondary | ICD-10-CM | POA: Insufficient documentation

## 2015-04-25 DIAGNOSIS — Z8249 Family history of ischemic heart disease and other diseases of the circulatory system: Secondary | ICD-10-CM | POA: Diagnosis not present

## 2015-04-25 DIAGNOSIS — Z7982 Long term (current) use of aspirin: Secondary | ICD-10-CM | POA: Insufficient documentation

## 2015-04-25 DIAGNOSIS — R21 Rash and other nonspecific skin eruption: Secondary | ICD-10-CM | POA: Insufficient documentation

## 2015-04-25 DIAGNOSIS — Z9010 Acquired absence of unspecified breast and nipple: Secondary | ICD-10-CM | POA: Diagnosis not present

## 2015-04-25 DIAGNOSIS — I700 Atherosclerosis of aorta: Secondary | ICD-10-CM | POA: Diagnosis not present

## 2015-04-25 DIAGNOSIS — E875 Hyperkalemia: Secondary | ICD-10-CM | POA: Diagnosis not present

## 2015-04-25 DIAGNOSIS — E119 Type 2 diabetes mellitus without complications: Secondary | ICD-10-CM | POA: Diagnosis not present

## 2015-04-25 DIAGNOSIS — Z806 Family history of leukemia: Secondary | ICD-10-CM | POA: Insufficient documentation

## 2015-04-25 DIAGNOSIS — Z79899 Other long term (current) drug therapy: Secondary | ICD-10-CM | POA: Diagnosis not present

## 2015-04-25 DIAGNOSIS — E785 Hyperlipidemia, unspecified: Secondary | ICD-10-CM | POA: Diagnosis not present

## 2015-04-25 DIAGNOSIS — I4510 Unspecified right bundle-branch block: Secondary | ICD-10-CM | POA: Insufficient documentation

## 2015-04-25 DIAGNOSIS — M810 Age-related osteoporosis without current pathological fracture: Secondary | ICD-10-CM | POA: Diagnosis not present

## 2015-04-25 DIAGNOSIS — Z833 Family history of diabetes mellitus: Secondary | ICD-10-CM | POA: Diagnosis not present

## 2015-04-25 DIAGNOSIS — K219 Gastro-esophageal reflux disease without esophagitis: Secondary | ICD-10-CM | POA: Diagnosis not present

## 2015-04-25 LAB — GLUCOSE, CAPILLARY
GLUCOSE-CAPILLARY: 106 mg/dL — AB (ref 65–99)
Glucose-Capillary: 167 mg/dL — ABNORMAL HIGH (ref 65–99)
Glucose-Capillary: 102 mg/dL — ABNORMAL HIGH (ref 65–99)

## 2015-04-25 LAB — BASIC METABOLIC PANEL
Anion gap: 10 (ref 5–15)
BUN: 22 mg/dL — AB (ref 6–20)
CHLORIDE: 110 mmol/L (ref 101–111)
CO2: 21 mmol/L — AB (ref 22–32)
CREATININE: 0.88 mg/dL (ref 0.44–1.00)
Calcium: 9.6 mg/dL (ref 8.9–10.3)
GFR calc Af Amer: >60 mL/min (ref >60)
GFR calc non Af Amer: 57 mL/min — ABNORMAL LOW (ref >60)
Glucose, Bld: 158 mg/dL — ABNORMAL HIGH (ref 65–99)
POTASSIUM: 3.9 mmol/L (ref 3.5–5.1)
Sodium: 141 mmol/L (ref 135–145)

## 2015-04-25 LAB — TROPONIN I
Troponin I: <0.03 ng/mL (ref <0.031)
Troponin I: <0.03 ng/mL (ref <0.031)

## 2015-04-25 LAB — CBC
HEMATOCRIT: 37.7 % (ref 35.0–47.0)
Hemoglobin: 12.4 g/dL (ref 12.0–16.0)
MCH: 30.5 pg (ref 26.0–34.0)
MCHC: 32.8 g/dL (ref 32.0–36.0)
MCV: 92.9 fL (ref 80.0–100.0)
PLATELETS: 182 10*3/uL (ref 150–440)
RBC: 4.06 MIL/uL (ref 3.80–5.20)
RDW: 14.3 % (ref 11.5–14.5)
WBC: 4 10*3/uL (ref 3.6–11.0)

## 2015-04-25 MED ORDER — PANTOPRAZOLE SODIUM 40 MG PO TBEC
40.0000 mg | DELAYED_RELEASE_TABLET | Freq: Every day | ORAL | Status: DC
  Administered 2015-04-26: 40 mg via ORAL
  Filled 2015-04-25: qty 1

## 2015-04-25 MED ORDER — ENOXAPARIN SODIUM 40 MG/0.4ML ~~LOC~~ SOLN
40.0000 mg | SUBCUTANEOUS | Status: DC
  Administered 2015-04-25 – 2015-04-26 (×2): 40 mg via SUBCUTANEOUS
  Filled 2015-04-25 (×4): qty 0.4

## 2015-04-25 MED ORDER — ACETAMINOPHEN 650 MG RE SUPP: 650.0000 mg | Freq: Four times a day (QID) | RECTAL | Status: DC | PRN

## 2015-04-25 MED ORDER — CALCIUM-MAGNESIUM-ZINC 333-133-5 MG PO TABS: 1.0000 | ORAL_TABLET | Freq: Every day | ORAL | Status: DC

## 2015-04-25 MED ORDER — ASPIRIN EC 81 MG PO TBEC
81.0000 mg | DELAYED_RELEASE_TABLET | Freq: Every day | ORAL | Status: DC
  Administered 2015-04-26: 81 mg via ORAL
  Filled 2015-04-25: qty 1

## 2015-04-25 MED ORDER — SODIUM CHLORIDE 0.9 % IJ SOLN
3.0000 mL | Freq: Two times a day (BID) | INTRAMUSCULAR | Status: DC
  Administered 2015-04-25 – 2015-04-26 (×3): 3 mL via INTRAVENOUS

## 2015-04-25 MED ORDER — ACETAMINOPHEN 325 MG PO TABS: 650.0000 mg | ORAL_TABLET | Freq: Four times a day (QID) | ORAL | Status: DC | PRN

## 2015-04-25 MED ORDER — LEVOTHYROXINE SODIUM 75 MCG PO TABS
75.0000 ug | ORAL_TABLET | Freq: Every day | ORAL | Status: DC
  Administered 2015-04-26: 75 ug via ORAL
  Filled 2015-04-25: qty 1

## 2015-04-25 MED ORDER — ATORVASTATIN CALCIUM 20 MG PO TABS
20.0000 mg | ORAL_TABLET | Freq: Every day | ORAL | Status: DC
  Administered 2015-04-25 – 2015-04-26 (×2): 20 mg via ORAL
  Filled 2015-04-25 (×2): qty 1

## 2015-04-25 MED ORDER — INSULIN ASPART 100 UNIT/ML ~~LOC~~ SOLN: 0.0000 [IU] | Freq: Every day | SUBCUTANEOUS | Status: DC

## 2015-04-25 MED ORDER — LINAGLIPTIN 5 MG PO TABS
5.0000 mg | ORAL_TABLET | Freq: Every day | ORAL | Status: DC
  Administered 2015-04-25: 5 mg via ORAL
  Filled 2015-04-25: qty 1

## 2015-04-25 MED ORDER — OMEGA-3-ACID ETHYL ESTERS 1 G PO CAPS: 1.0000 g | ORAL_CAPSULE | Freq: Two times a day (BID) | ORAL | Status: DC

## 2015-04-25 MED ORDER — NITROGLYCERIN 0.4 MG SL SUBL
0.4000 mg | SUBLINGUAL_TABLET | SUBLINGUAL | Status: DC | PRN
  Administered 2015-04-25 (×3): 0.4 mg via SUBLINGUAL
  Filled 2015-04-25 (×3): qty 1

## 2015-04-25 MED ORDER — GLIMEPIRIDE 2 MG PO TABS
2.0000 mg | ORAL_TABLET | Freq: Every day | ORAL | Status: DC
Start: 2015-04-25 — End: 2015-04-26

## 2015-04-25 MED ORDER — INSULIN ASPART 100 UNIT/ML ~~LOC~~ SOLN
0.0000 [IU] | Freq: Three times a day (TID) | SUBCUTANEOUS | Status: DC
  Administered 2015-04-25: 2 [IU] via SUBCUTANEOUS
  Administered 2015-04-26: 1 [IU] via SUBCUTANEOUS
  Filled 2015-04-25: qty 1
  Filled 2015-04-25: qty 2

## 2015-04-25 MED ORDER — AMLODIPINE BESYLATE 5 MG PO TABS
5.0000 mg | ORAL_TABLET | Freq: Every day | ORAL | Status: DC
  Administered 2015-04-26: 5 mg via ORAL
  Filled 2015-04-25: qty 1

## 2015-04-25 MED ORDER — OXYCODONE HCL 5 MG PO TABS: 5.0000 mg | ORAL_TABLET | ORAL | Status: DC | PRN

## 2015-04-25 MED ORDER — METFORMIN HCL 500 MG PO TABS
500.0000 mg | ORAL_TABLET | Freq: Two times a day (BID) | ORAL | Status: DC
Start: 2015-04-25 — End: 2015-04-26
  Administered 2015-04-25: 500 mg via ORAL
  Filled 2015-04-25: qty 1

## 2015-04-25 MED ORDER — OMEGA-3-ACID ETHYL ESTERS 1 G PO CAPS
1.0000 g | ORAL_CAPSULE | Freq: Every day | ORAL | Status: DC
  Administered 2015-04-25 – 2015-04-26 (×2): 1 g via ORAL
  Filled 2015-04-25 (×2): qty 1

## 2015-04-25 MED ORDER — OMEGA-3 FATTY ACIDS 1000 MG PO CAPS: 1.0000 g | ORAL_CAPSULE | Freq: Every day | ORAL | Status: DC

## 2015-04-25 NOTE — H&P (Signed)
[facility] [place] NAME: Pamela Hanna    MR#:  [id number]  DATE OF BIRTH:  08-24-1924  DATE OF ADMISSION:  04/25/2015  PRIMARY CARE PHYSICIAN: Crecencio Mc, MD   REQUESTING/REFERRING PHYSICIAN: Dr. Mariea Clonts  CHIEF COMPLAINT:   Chief Complaint  Patient presents with  . Chest Pain    HISTORY OF PRESENT ILLNESS:  Pamela Hanna  is a 81 y.o. female with a known history of hypertension, diabetes, breast cancer and hypothyroidism presents to the ER with chest pain. The chest pain woke her up from sleep around 3 in the morning lasted for about 3 hours constant left breast area she took an aspirin and indigestion pel. It radiated up into left shoulder and into her back. Pressure like pain 10 out of 10 intensity. Currently no pain. Associated with shortness of breath dizziness and some weakness in the legs. No nausea or vomiting. Hospitalist services were contacted for further evaluation. No diaphoresis. Some shortness of breath.  PAST MEDICAL HISTORY:   Past Medical History  Diagnosis Date  . Breast Cancer 1981    ledt mastectomy  . Diabetes mellitus   . GERD (gastroesophageal reflux disease)   . Osteoporosis, post-menopausal     managedwith bisphosphonates since 2005 with no change  . Hypertension   . Hyperlipidemia     PAST SURGICAL HISTORY:   Past Surgical History  Procedure Laterality Date  . Abdominal hysterectomy  1969    menorrhagia  . Cesarean section    . Tumor removal  2006    thoracic  back , benign  . Breast surgery  1981    left radical mastectomy with implant    SOCIAL HISTORY:   Social History  Substance Use Topics  . Smoking status: Never Smoker   . Smokeless tobacco: Never Used  . Alcohol Use: No    FAMILY HISTORY:   Family History  Problem Relation Age of Onset  . Diabetes Mother   . Hypertension Father   . Throat cancer Father   . Lung cancer Sister   . Leukemia Sister   . Liver cancer Sister      DRUG ALLERGIES:   Allergies  Allergen Reactions  . Valsartan     Hyperkalemia     REVIEW OF SYSTEMS:  CONSTITUTIONAL: No fever, positive for weakness in the legs.  EYES: No blurred or double vision.  EARS, NOSE, AND THROAT: No tinnitus or ear pain. No sore throat RESPIRATORY: No cough, positive for shortness of breath, no wheezing or hemoptysis.  CARDIOVASCULAR: Positive for chest pain, no orthopnea, edema.  GASTROINTESTINAL: No nausea, vomiting, diarrhea or abdominal pain. No blood in bowel movements GENITOURINARY: No dysuria, hematuria.  ENDOCRINE: No polyuria, nocturia,  HEMATOLOGY: No anemia, easy bruising or bleeding SKIN: No rash or lesion. MUSCULOSKELETAL: Positive for knee pain  NEUROLOGIC: No tingling, numbness, weakness.  PSYCHIATRY: No anxiety or depression.   MEDICATIONS AT HOME:   Prior to Admission medications   Medication Sig Start Date End Date Taking? Authorizing Provider  acetaminophen (TYLENOL) 325 MG tablet Take 325 mg by mouth 2 (two) times daily.   Yes Historical Provider, MD  amLODipine (NORVASC) 5 MG tablet TAKE ONE (1) TABLET BY MOUTH EVERY DAY 12/02/14  Yes Crecencio Mc, MD  aspirin 81 MG tablet Take 1 tablet (81 mg total) by mouth 2 (two) times a week. 01/31/15  Yes Crecencio Mc, MD  atorvastatin (LIPITOR) 20 MG tablet Take 20 mg by  mouth daily.   Yes Historical Provider, MD  Calcium-Magnesium-Zinc 3080952730 MG TABS Take 1 tablet by mouth daily.   Yes Historical Provider, MD  fish oil-omega-3 fatty acids 1000 MG capsule Take 1 g by mouth daily.    Yes Historical Provider, MD  furosemide (LASIX) 20 MG tablet Take 1 tablet (20 mg total) by mouth daily. As needed for fluid retention 01/13/14  Yes Crecencio Mc, MD  glimepiride (AMARYL) 2 MG tablet TAKE ONE (1) TABLET BY MOUTH TWO (2) TIMES DAILY 11/09/14  Yes Crecencio Mc, MD  glucose blood (BAYER CONTOUR NEXT TEST) test strip Use as instructed 04/28/13  Yes Crecencio Mc, MD  metFORMIN (GLUCOPHAGE)  500 MG tablet TAKE ONE TABLET TWICE A DAY WITH FOOD 11/30/14  Yes Crecencio Mc, MD  naproxen sodium (ANAPROX) 220 MG tablet Take 220 mg by mouth 2 (two) times daily with a meal.   Yes Historical Provider, MD  omeprazole (PRILOSEC) 20 MG capsule TAKE ONE CAPSULE BY MOUTH DAILY 11/02/14  Yes Crecencio Mc, MD  sitaGLIPtin (JANUVIA) 100 MG tablet Take 1 tablet (100 mg total) by mouth daily. 08/11/14  Yes Crecencio Mc, MD  SYNTHROID 75 MCG tablet TAKE ONE (1) TABLET BY MOUTH EVERY DAY 08/17/14  Yes Crecencio Mc, MD  atorvastatin (LIPITOR) 40 MG tablet TAKE ONE (1) TABLET BY MOUTH EVERY DAY 02/09/15   Crecencio Mc, MD      VITAL SIGNS:  Blood pressure 135/56, pulse 60, temperature 98.2 F (36.8 C), temperature source Oral, resp. rate 14, height 5\' 5"  (1.651 m), weight 63.504 kg (140 lb), SpO2 98 %.  PHYSICAL EXAMINATION:  GENERAL:  81 y.o.-year-old patient lying in the bed with no acute distress.  EYES: Pupils equal, round, reactive to light and accommodation. No scleral icterus. Extraocular muscles intact.  HEENT: Head atraumatic, normocephalic. Oropharynx and nasopharynx clear.  NECK:  Supple, no jugular venous distention. No thyroid enlargement, no tenderness.  LUNGS: Normal breath sounds bilaterally, no wheezing, rales,rhonchi or crepitation. No use of accessory muscles of respiration.  CARDIOVASCULAR: S1, S2 normal. No murmurs, rubs, or gallops.  ABDOMEN: Soft, nontender, nondistended. Bowel sounds present. No organomegaly or mass.  EXTREMITIES: No pedal edema, cyanosis, or clubbing.  NEUROLOGIC: Cranial nerves II through XII are intact. Muscle strength 5/5 in all extremities. Sensation intact. Gait not checked.  PSYCHIATRIC: The patient is alert and oriented x 3.  SKIN: Dry skin on the palms  LABORATORY PANEL:   CBC  Recent Labs Lab 04/25/15 0657  WBC 4.0  HGB 12.4  HCT 37.7  PLT 182    ------------------------------------------------------------------------------------------------------------------  Chemistries   Recent Labs Lab 04/25/15 0657  NA 141  K 3.9  CL 110  CO2 21*  GLUCOSE 158*  BUN 22*  CREATININE 0.88  CALCIUM 9.6   ------------------------------------------------------------------------------------------------------------------  Cardiac Enzymes  Recent Labs Lab 04/25/15 0657  TROPONINI <0.03   ------------------------------------------------------------------------------------------------------------------  RADIOLOGY:  Dg Chest 2 View  04/25/2015   CLINICAL DATA:  Anterior upper chest pain since 3 a.m. today, personal history of breast cancer  EXAM: CHEST  2 VIEW  COMPARISON:  None.  FINDINGS: Heart size and vascular pattern are normal. Lungs are clear. Postsurgical change on the left consistent with mastectomy and axillary node dissection. Multiple nonacute appearing posterior left rib fractures. No pleural effusion. Aortic calcification.  IMPRESSION: No acute findings.  Atherosclerotic aortic calcification.   Electronically Signed   By: Skipper Cliche M.D.   On: 04/25/2015 07:19  EKG:   Normal sinus rhythm, right bundle branch block, insignificant Q waves in 3 and aVF  IMPRESSION AND PLAN:   1. Chest pain- we will admit to telemetry as observation get a stress test in the morning if cardiac enzymes are negative. Get 2 more troponins and make sure they are negative. Continue aspirin. 2. Gastroesophageal reflux disease on PPI 3. Diabetes Will continue diabetic medications this morning and hold them tomorrow morning. 4. Essential hypertension continue usual medications 5. Hypothyroidism continue levothyroxine 6. Hyperlipidemia unspecified on atorvastatin and fish oil 7. History of breast cancer follow-up as outpatient  All the records are reviewed and case discussed with ED provider. Management plans discussed with the patient,  family and they are in agreement.  CODE STATUS: DO NOT RESUSCITATE  TOTAL TIME TAKING CARE OF THIS PATIENT: 55 minutes.    Loletha Grayer M.D on 04/25/2015 at 9:22 AM  Between 7am to 6pm - Pager - [phone]  After 6pm call admission pager [facility]  Office  [phone]  CC: Primary care physician; Crecencio Mc, MD

## 2015-04-25 NOTE — ED Provider Notes (Signed)
[facility] Emergency Department Provider Note  ____________________________________________  Time seen: Approximately 7:16 AM  I have reviewed the triage vital signs and the nursing notes.   HISTORY  Chief Complaint Chest Pain    HPI Pamela Hanna is a 81 y.o. female DM, and remote breast cancer presenting with chest pain. Patient states that she awoke at 3 AM with a severe left-sided "deep" pressure in her chest with occasional radiation to the left shoulder. No associated nausea, vomiting, diaphoresis, or palpitations. No improvement with ambulation; it did feel better if she would push down on it. She took 1 low-dose aspirin without improvement. After 2 hours of persistent pain, the patient called EMS and pain improved from 10/10 to 6/10 with 3 additional low-dose aspirin and nitroglycerin paste. She denies any recent illness, fever, chills, syncope, calf pain.  No personal or family history of blood clots. Patient has no known history of CAD and has not previously undergone catheterization or stress testing.   Past Medical History  Diagnosis Date  . Breast Cancer 1981    ledt mastectomy  . Diabetes mellitus   . GERD (gastroesophageal reflux disease)   . Osteoporosis, post-menopausal     managedwith bisphosphonates since 2005 with no change  . Hypertension   . Hyperlipidemia     Patient Active Problem List   Diagnosis Date Noted  . Chest pain 04/25/2015  . Edema 04/04/2014  . Hyperkalemia 12/31/2013  . Rash and nonspecific skin eruption 12/18/2013  . Proximal muscle weakness 12/18/2013  . Fatty liver disease, nonalcoholic 41/32/4401  . History of breast cancer 04/29/2012  . Hypertension 01/16/2012  . GERD (gastroesophageal reflux disease) 01/16/2012  . Hypothyroidism 01/16/2012  . Hyperlipidemia   . Diabetes mellitus without complication   . Osteoporosis, post-menopausal     Past Surgical History  Procedure Laterality Date  . Abdominal  hysterectomy  1969    menorrhagia  . Cesarean section    . Tumor removal  2006    thoracic  back , benign  . Breast surgery  1981    left radical mastectomy with implant    No current outpatient prescriptions on file.  Allergies Valsartan  Family History  Problem Relation Age of Onset  . Diabetes Mother   . Hypertension Father   . Throat cancer Father   . Lung cancer Sister   . Leukemia Sister   . Liver cancer Sister     Social History Social History  Substance Use Topics  . Smoking status: Never Smoker   . Smokeless tobacco: Never Used  . Alcohol Use: No    Review of Systems Constitutional: No fever/chills negative for diaphoresis Eyes: No visual changes. ENT: No sore throat. Cardiovascular: Positive for chest pain. Negative for palpitations.  Respiratory: Denies shortness of breath.  No cough. Gastrointestinal: No abdominal pain.  No nausea, no vomiting.  No diarrhea.  No constipation. Genitourinary: Negative for dysuria. Musculoskeletal: Negative for back pain. Positive for radiating left shoulder pain. Skin: Negative for rash. Neurological: Negative for headaches, focal weakness or numbness.  10-point ROS otherwise negative.  ____________________________________________   PHYSICAL EXAM:  VITAL SIGNS: ED Triage Vitals  Enc Vitals Group     BP 04/25/15 0646 216/71 mmHg     Pulse Rate 04/25/15 0646 74     Resp 04/25/15 0646 20     Temp 04/25/15 0646 98.2 F (36.8 C)     Temp Source 04/25/15 0646 Oral     SpO2 04/25/15 0646 100 %  Weight 04/25/15 0646 140 lb (63.504 kg)     Height 04/25/15 0646 5\' 5"  (1.651 m)     Head Cir --      Peak Flow --      Pain Score 04/25/15 0646 8     Pain Loc --      Pain Edu? --      Excl. in [place]? --     Constitutional: Alert and oriented. Well appearing and in no acute distress. Answers questions appropriately. Eyes: Conjunctivae are normal.  EOMI. Head: Atraumatic. Nose: No  congestion/rhinnorhea. Mouth/Throat: Mucous membranes are moist.  Neck: No stridor.  Supple. No JVD Cardiovascular: Normal rate, regular rhythm. No murmurs, rubs or gallops. Left-sided remote and well-healed mastectomy. Respiratory: Normal respiratory effort.  No retractions. Lungs CTAB.  No wheezes, rales or ronchi. Gastrointestinal: Soft and nontender. No distention. No peritoneal signs. Musculoskeletal: No LE edema. No calf pain, palpable cords, or Homans sign. Neurologic:  Normal speech and language. No gross focal neurologic deficits are appreciated.  Skin:  Skin is warm, dry and intact. No rash noted. Psychiatric: Mood and affect are normal. Speech and behavior are normal.  Normal judgement.  ____________________________________________   LABS (all labs ordered are listed, but only abnormal results are displayed)  Labs Reviewed  BASIC METABOLIC PANEL - Abnormal; Notable for the following:    CO2 21 (*)    Glucose, Bld 158 (*)    BUN 22 (*)    GFR calc non Af Amer 57 (*)    All other components within normal limits  GLUCOSE, CAPILLARY - Abnormal; Notable for the following:    Glucose-Capillary 167 (*)    All other components within normal limits  CBC  TROPONIN I  TROPONIN I  TROPONIN I   ____________________________________________  EKG  ED ECG REPORT I, Eula Listen, the attending physician, personally viewed and interpreted this ECG.   Date: 04/25/2015  EKG Time: 6:49   Rate: 75  Rhythm: there are no previous tracings available for comparison; normal sinus rhythm, RBBB  Axis: Leftward   Intervals:none  ST&T Change: T-wave inversion in V1. No ST elevation. There is some slurring of the ST segment in leads 1, aVR, and V6 and V5.  Plan to repeat EKG.  ____________________________________________  RADIOLOGY  Dg Chest 2 View  04/25/2015   CLINICAL DATA:  Anterior upper chest pain since 3 a.m. today, personal history of breast cancer  EXAM: CHEST  2 VIEW   COMPARISON:  None.  FINDINGS: Heart size and vascular pattern are normal. Lungs are clear. Postsurgical change on the left consistent with mastectomy and axillary node dissection. Multiple nonacute appearing posterior left rib fractures. No pleural effusion. Aortic calcification.  IMPRESSION: No acute findings.  Atherosclerotic aortic calcification.   Electronically Signed   By: Skipper Cliche M.D.   On: 04/25/2015 07:19    ____________________________________________   PROCEDURES  Procedure(s) performed: None  Critical Care performed: No ____________________________________________   INITIAL IMPRESSION / ASSESSMENT AND PLAN / ED COURSE  Pertinent labs & imaging results that were available during my care of the patient were reviewed by me and considered in my medical decision making (see chart for details).  81 year old female with multiple cardiac risk factors, no known CAD, presenting with left-sided chest pain. EKG does not show STEMI, but there are some changes that may be indicative of ischemic changes. Unfortunately, we have no previous EKG to compare with. At this time, the patient's symptoms are improving. We'll continue symptom management, get  labs, and repeat EKG. Chest x-ray is also pending. Anticipate admission for evaluation of ACS/MI. Other possible etiologies include esophageal spasm, much less likely PE given her lack of risk factors, stable vital signs, lack of shortness of breath, and no indication of DVT. The patient is not giving any history that would be consistent with infection causing this pain.  ----------------------------------------- 8:09 AM on 04/25/2015 -----------------------------------------  The patient's pain is 1/10 after sublingual nitroglycerin. Her troponin is negative. Chest x-ray does not show any acute cardiopulmonary process. I will plan to admit her to the internist for further ACS  evaluation.  ____________________________________________  FINAL CLINICAL IMPRESSION(S) / ED DIAGNOSES  Final diagnoses:  Chest pain at rest      NEW MEDICATIONS STARTED DURING THIS VISIT:  Current Discharge Medication List       Eula Listen, MD 04/25/15 [phone]

## 2015-04-25 NOTE — ED Notes (Signed)
Pt to rm 10 via EMS from home.  Pt report CP starting at 3am, left sided, radiating to back and left shoulder, described as throbbing.  Hx HTN.  Pt received 324 ASA and 2inch nitro paste to left upper chest w/ EMS>  Pt NAD at this time.

## 2015-04-26 ENCOUNTER — Telehealth: Payer: Self-pay | Admitting: *Deleted

## 2015-04-26 ENCOUNTER — Telehealth: Payer: Self-pay

## 2015-04-26 ENCOUNTER — Observation Stay: Payer: Medicare Other

## 2015-04-26 DIAGNOSIS — R079 Chest pain, unspecified: Secondary | ICD-10-CM | POA: Diagnosis not present

## 2015-04-26 LAB — CBC
HEMATOCRIT: 35.2 % (ref 35.0–47.0)
HEMOGLOBIN: 12 g/dL (ref 12.0–16.0)
MCH: 31.7 pg (ref 26.0–34.0)
MCHC: 34.2 g/dL (ref 32.0–36.0)
MCV: 92.8 fL (ref 80.0–100.0)
Platelets: 182 10*3/uL (ref 150–440)
RBC: 3.79 MIL/uL — ABNORMAL LOW (ref 3.80–5.20)
RDW: 14.7 % — AB (ref 11.5–14.5)
WBC: 4.3 10*3/uL (ref 3.6–11.0)

## 2015-04-26 LAB — NM MYOCAR MULTI W/SPECT W/WALL MOTION / EF
CSEPPHR: 84 {beats}/min
LV dias vol: 40 mL
LVSYSVOL: 22 mL
Percent HR: 63 %
Rest HR: 59 {beats}/min
SDS: 0
SRS: 3
SSS: 0
TID: 0.97

## 2015-04-26 LAB — BASIC METABOLIC PANEL
Anion gap: 9 (ref 5–15)
BUN: 26 mg/dL — AB (ref 6–20)
CHLORIDE: 108 mmol/L (ref 101–111)
CO2: 23 mmol/L (ref 22–32)
Calcium: 9.2 mg/dL (ref 8.9–10.3)
Creatinine, Ser: 0.91 mg/dL (ref 0.44–1.00)
GFR calc Af Amer: >60 mL/min (ref >60)
GFR calc non Af Amer: 55 mL/min — ABNORMAL LOW (ref >60)
GLUCOSE: 112 mg/dL — AB (ref 65–99)
POTASSIUM: 3.7 mmol/L (ref 3.5–5.1)
SODIUM: 140 mmol/L (ref 135–145)

## 2015-04-26 LAB — GLUCOSE, CAPILLARY
GLUCOSE-CAPILLARY: 129 mg/dL — AB (ref 65–99)
Glucose-Capillary: 126 mg/dL — ABNORMAL HIGH (ref 65–99)

## 2015-04-26 MED ORDER — TECHNETIUM TC 99M SESTAMIBI - CARDIOLITE
33.0000 | Freq: Once | INTRAVENOUS | Status: AC | PRN
  Administered 2015-04-26: 32.714 via INTRAVENOUS

## 2015-04-26 MED ORDER — REGADENOSON 0.4 MG/5ML IV SOLN
0.4000 mg | Freq: Once | INTRAVENOUS | Status: AC
  Administered 2015-04-26: 0.4 mg via INTRAVENOUS

## 2015-04-26 MED ORDER — TECHNETIUM TC 99M SESTAMIBI - CARDIOLITE
13.0000 | Freq: Once | INTRAVENOUS | Status: AC | PRN
  Administered 2015-04-26: 08:00:00 13.554 via INTRAVENOUS

## 2015-04-26 NOTE — Discharge Instructions (Signed)
Chest Pain Observation  It is often hard to give a specific diagnosis for the cause of chest pain. Among other possibilities your symptoms might be caused by inadequate oxygen delivery to your heart (angina). Angina that is not treated or evaluated can lead to a heart attack (myocardial infarction) or death.  Blood tests, electrocardiograms, and X-rays may have been done to help determine a possible cause of your chest pain. After evaluation and observation, your health care provider has determined that it is unlikely your pain was caused by an unstable condition that requires hospitalization. However, a full evaluation of your pain may need to be completed, with additional diagnostic testing as directed. It is very important to keep your follow-up appointments. Not keeping your follow-up appointments could result in permanent heart damage, disability, or death. If there is any problem keeping your follow-up appointments, you must call your health care provider.  HOME CARE INSTRUCTIONS   Due to the slight chance that your pain could be angina, it is important to follow your health care provider's treatment plan and also maintain a healthy lifestyle:  · Maintain or work toward achieving a healthy weight.  · Stay physically active and exercise regularly.  · Decrease your salt intake.  · Eat a balanced, healthy diet. Talk to a dietitian to learn about heart-healthy foods.  · Increase your fiber intake by including whole grains, vegetables, fruits, and nuts in your diet.  · Avoid situations that cause stress, anger, or depression.  · Take medicines as advised by your health care provider. Report any side effects to your health care provider. Do not stop medicines or adjust the dosages on your own.  · Quit smoking. Do not use nicotine patches or gum until you check with your health care provider.  · Keep your blood pressure, blood sugar, and cholesterol levels within normal limits.  · Limit alcohol intake to no more than  1 drink per day for women who are not pregnant and 2 drinks per day for men.  · Do not abuse drugs.  SEEK IMMEDIATE MEDICAL CARE IF:  You have severe chest pain or pressure which may include symptoms such as:  · You feel pain or pressure in your arms, neck, jaw, or back.  · You have severe back or abdominal pain, feel sick to your stomach (nauseous), or throw up (vomit).  · You are sweating profusely.  · You are having a fast or irregular heartbeat.  · You feel short of breath while at rest.  · You notice increasing shortness of breath during rest, sleep, or with activity.  · You have chest pain that does not get better after rest or after taking your usual medicine.  · You wake from sleep with chest pain.  · You are unable to sleep because you cannot breathe.  · You develop a frequent cough or you are coughing up blood.  · You feel dizzy, faint, or experience extreme fatigue.  · You develop severe weakness, dizziness, fainting, or chills.  Any of these symptoms may represent a serious problem that is an emergency. Do not wait to see if the symptoms will go away. Call your local emergency services (911 in the [place] Do not drive yourself to the hospital.  MAKE SURE YOU:  · Understand these instructions.  · Will watch your condition.  · Will get help right away if you are not doing well or get worse.  Document Released: 08/26/2010 Document Revised: 07/29/2013 Document Reviewed: 01/23/2013    Exit[employer]® Patient Information ©2015 [employer]. This information is not intended to replace advice given to you by your health care provider. Make sure you discuss any questions you have with your health care provider.

## 2015-04-26 NOTE — Telephone Encounter (Signed)
Transition Care Management Follow-up Telephone Call   Date discharged?04/26/15   How have you been since you were released from the hospital? Great! My legs are a little weak from lying around, but I am doing great.  I am monitoring my blood sugar and taking the medication the way I am supposed to be.   Do you understand why you were in the hospital? Yes   Do you understand the discharge instructions? Yes   Where were you discharged to? Home   Items Reviewed:  Medications reviewed: Yes  Allergies reviewed: Yes  Dietary changes reviewed: Yes  Referrals reviewed: Yes   Functional Questionnaire:   Activities of Daily Living (ADLs):   She states they are independent in the following: All ADLs. States they require assistance with the following: No assistance required at this time.   Any transportation issues/concerns?: No, and I have my daughters if I need anything.   Any patient concerns? None at this time.   Confirmed importance and date/time of follow-up visits scheduled Yes, appointment made 04/29/15.  Provider Appointment booked with Dr. Derrel Nip (PCP).  Confirmed with patient if condition begins to worsen call PCP or go to the ER.  Patient was given the office number and encouraged to call back with question or concerns.  : Yes, patient verbalized understanding.

## 2015-04-26 NOTE — Telephone Encounter (Signed)
TCM call and appointment made.

## 2015-04-26 NOTE — Telephone Encounter (Signed)
Thank you! Will continue to follow. 

## 2015-04-26 NOTE — Telephone Encounter (Signed)
Patient will be discharged from the hospital today 09/19, she will need a hospital follow up appt, within a week. Please advise were to place her on the schedule. -Thanks

## 2015-04-26 NOTE — Discharge Summary (Signed)
[place] [facility] [place] NAME: Pamela Hanna    MR#:  [id number]  DATE OF BIRTH:  1925-04-02  DATE OF ADMISSION:  04/25/2015 ADMITTING PHYSICIAN: Loletha Grayer, MD  DATE OF DISCHARGE: 04/26/2015  PRIMARY CARE PHYSICIAN: Crecencio Mc, MD    ADMISSION DIAGNOSIS:  Chest pain at rest [R07.9] Chest pain [R07.9]  DISCHARGE DIAGNOSIS:  Active Problems:   Chest pain   SECONDARY DIAGNOSIS:   Past Medical History  Diagnosis Date  . Breast Cancer 1981    ledt mastectomy  . Diabetes mellitus   . GERD (gastroesophageal reflux disease)   . Osteoporosis, post-menopausal     managedwith bisphosphonates since 2005 with no change  . Hypertension   . Hyperlipidemia     HOSPITAL COURSE:   1. Chest pain that woke her up from sleep. Patient's cardiac enzymes were negative 3. The patient will undergo a stress test today. If the stress test is negative she will be discharged home. 2. Essential hypertension- usual medications were continued. 3. Diabetes mellitus type 2- her oral medications were continued 4. Gastroesophageal reflux disease without esophagitis continue PPI 5. History of breast cancer 6. Hyperlipidemia unspecified on atorvastatin.  DISCHARGE CONDITIONS:   Satisfactory  CONSULTS OBTAINED:  None  DRUG ALLERGIES:   Allergies  Allergen Reactions  . Valsartan     Hyperkalemia     DISCHARGE MEDICATIONS:   Current Discharge Medication List    CONTINUE these medications which have NOT CHANGED   Details  acetaminophen (TYLENOL) 325 MG tablet Take 500 mg by mouth 2 (two) times daily as needed.     amLODipine (NORVASC) 5 MG tablet TAKE ONE (1) TABLET BY MOUTH EVERY DAY Qty: 90 tablet, Refills: 1    aspirin 81 MG tablet Take 1 tablet (81 mg total) by mouth 2 (two) times a week. Qty: 30 tablet, Refills: 11    atorvastatin (LIPITOR) 20 MG tablet Take 20 mg by mouth daily.    Calcium-Magnesium-Zinc 333-133-5 MG TABS  Take 1 tablet by mouth daily.    fish oil-omega-3 fatty acids 1000 MG capsule Take 1,200 mg by mouth daily.     furosemide (LASIX) 20 MG tablet Take 1 tablet (20 mg total) by mouth daily. As needed for fluid retention Qty: 30 tablet, Refills: 3    glimepiride (AMARYL) 2 MG tablet TAKE ONE (1) TABLET BY MOUTH TWO (2) TIMES DAILY Qty: 180 tablet, Refills: 1    glucose blood (BAYER CONTOUR NEXT TEST) test strip Use as instructed Qty: 100 each, Refills: 12    metFORMIN (GLUCOPHAGE) 500 MG tablet TAKE ONE TABLET TWICE A DAY WITH FOOD Qty: 180 tablet, Refills: 1    naproxen sodium (ANAPROX) 220 MG tablet Take 220 mg by mouth 2 (two) times daily with a meal.    omeprazole (PRILOSEC) 20 MG capsule TAKE ONE CAPSULE BY MOUTH DAILY Qty: 90 capsule, Refills: 1    sitaGLIPtin (JANUVIA) 100 MG tablet Take 1 tablet (100 mg total) by mouth daily. Qty: 90 tablet, Refills: 1    SYNTHROID 75 MCG tablet TAKE ONE (1) TABLET BY MOUTH EVERY DAY Qty: 90 tablet, Refills: 3         DISCHARGE INSTRUCTIONS:   Follow-up PMD 1 week  If you experience worsening of your admission symptoms, develop shortness of breath, life threatening emergency, suicidal or homicidal thoughts you must seek medical attention immediately by calling 911 or calling your MD immediately  if symptoms less severe.  You Must  read complete instructions/literature along with all the possible adverse reactions/side effects for all the Medicines you take and that have been prescribed to you. Take any new Medicines after you have completely understood and accept all the possible adverse reactions/side effects.   Please note  You were cared for by a hospitalist during your hospital stay. If you have any questions about your discharge medications or the care you received while you were in the hospital after you are discharged, you can call the unit and asked to speak with the hospitalist on call if the hospitalist that took care of you is  not available. Once you are discharged, your primary care physician will handle any further medical issues. Please note that NO REFILLS for any discharge medications will be authorized once you are discharged, as it is imperative that you return to your primary care physician (or establish a relationship with a primary care physician if you do not have one) for your aftercare needs so that they can reassess your need for medications and monitor your lab values.    Today   CHIEF COMPLAINT:   Chief Complaint  Patient presents with  . Chest Pain    HISTORY OF PRESENT ILLNESS:  Pamela Hanna  is a 81 y.o. female with a known history of diabetes hypertension and hyperlipidemia. She presented with chest pain that woke her up at night and lasted for 3 hours and then went.   VITAL SIGNS:  Blood pressure 168/46, pulse 61, temperature 98.3 F (36.8 C), temperature source Oral, resp. rate 19, height 5\' 4"  (1.626 m), weight 66.679 kg (147 lb), SpO2 97 %.    PHYSICAL EXAMINATION:  GENERAL:  81 y.o.-year-old patient lying in the bed with no acute distress.  EYES: Pupils equal, round, reactive to light and accommodation. No scleral icterus. Extraocular muscles intact.  HEENT: Head atraumatic, normocephalic. Oropharynx and nasopharynx clear.  NECK:  Supple, no jugular venous distention. No thyroid enlargement, no tenderness.  LUNGS: Normal breath sounds bilaterally, no wheezing, rales,rhonchi or crepitation. No use of accessory muscles of respiration.  CARDIOVASCULAR: S1, S2 normal. No murmurs, rubs, or gallops.  ABDOMEN: Soft, non-tender, non-distended. Bowel sounds present. No organomegaly or mass.  EXTREMITIES: No pedal edema, cyanosis, or clubbing.  NEUROLOGIC: Cranial nerves II through XII are intact. Muscle strength 5/5 in all extremities. Sensation intact. Gait not checked.  PSYCHIATRIC: The patient is alert and oriented x 3.  SKIN: No obvious rash, lesion, or ulcer.   DATA REVIEW:    CBC  Recent Labs Lab 04/26/15 0419  WBC 4.3  HGB 12.0  HCT 35.2  PLT 182    Chemistries   Recent Labs Lab 04/26/15 0419  NA 140  K 3.7  CL 108  CO2 23  GLUCOSE 112*  BUN 26*  CREATININE 0.91  CALCIUM 9.2    Cardiac Enzymes  Recent Labs Lab 04/25/15 1739  TROPONINI <0.03    35  RADIOLOGY:  Dg Chest 2 View  04/25/2015   CLINICAL DATA:  Anterior upper chest pain since 3 a.m. today, personal history of breast cancer  EXAM: CHEST  2 VIEW  COMPARISON:  None.  FINDINGS: Heart size and vascular pattern are normal. Lungs are clear. Postsurgical change on the left consistent with mastectomy and axillary node dissection. Multiple nonacute appearing posterior left rib fractures. No pleural effusion. Aortic calcification.  IMPRESSION: No acute findings.  Atherosclerotic aortic calcification.   Electronically Signed   By: Skipper Cliche M.D.   On: 04/25/2015 07:19  EKG:   Right bundle branch block    Management plans discussed with the patient, family and they are in agreement.  CODE STATUS:     Code Status Orders        Start     Ordered   04/25/15 0913  Do not attempt resuscitation (DNR)   Continuous    Question Answer Comment  In the event of cardiac or respiratory ARREST Do not call a "code blue"   In the event of cardiac or respiratory ARREST Do not perform Intubation, CPR, defibrillation or ACLS   In the event of cardiac or respiratory ARREST Use medication by any route, position, wound care, and other measures to relive pain and suffering. May use oxygen, suction and manual treatment of airway obstruction as needed for comfort.   Comments Nurse may pronounce      04/25/15 0913    Advance Directive Documentation        Most Recent Value   Type of Advance Directive  Healthcare Power of Attorney, Living will, Out of facility DNR (pink MOST or yellow form) [POA penny howard and cathy lynch (daughters)]   Pre-existing out of facility DNR order (yellow  form or pink MOST form)  Physician notified to receive inpatient order   "MOST" Form in Place?        TOTAL TIME TAKING CARE OF THIS PATIENT: 35 minutes.    Loletha Grayer M.D on 04/26/2015 at 7:38 AM  Between 7am to 6pm - Pager - [phone]  After 6pm go to [email or web address] - password [email or web address] [facility]  Office  [phone]  CC: Primary care physician; Crecencio Mc, MD

## 2015-04-26 NOTE — Progress Notes (Signed)
Patient bp high , am HTN  meds given , md made aware conttinue to monitor

## 2015-04-29 ENCOUNTER — Ambulatory Visit (INDEPENDENT_AMBULATORY_CARE_PROVIDER_SITE_OTHER): Payer: Medicare Other | Admitting: Internal Medicine

## 2015-04-29 ENCOUNTER — Encounter: Payer: Self-pay | Admitting: Internal Medicine

## 2015-04-29 VITALS — BP 146/60 | HR 67 | Temp 98.7°F | Resp 12 | Ht 64.0 in | Wt 149.5 lb

## 2015-04-29 DIAGNOSIS — E559 Vitamin D deficiency, unspecified: Secondary | ICD-10-CM | POA: Diagnosis not present

## 2015-04-29 DIAGNOSIS — E1121 Type 2 diabetes mellitus with diabetic nephropathy: Secondary | ICD-10-CM

## 2015-04-29 DIAGNOSIS — R0789 Other chest pain: Secondary | ICD-10-CM | POA: Diagnosis not present

## 2015-04-29 DIAGNOSIS — E038 Other specified hypothyroidism: Secondary | ICD-10-CM

## 2015-04-29 DIAGNOSIS — Z23 Encounter for immunization: Secondary | ICD-10-CM | POA: Diagnosis not present

## 2015-04-29 DIAGNOSIS — K2100 Gastro-esophageal reflux disease with esophagitis, without bleeding: Secondary | ICD-10-CM

## 2015-04-29 DIAGNOSIS — E034 Atrophy of thyroid (acquired): Secondary | ICD-10-CM | POA: Diagnosis not present

## 2015-04-29 DIAGNOSIS — E119 Type 2 diabetes mellitus without complications: Secondary | ICD-10-CM

## 2015-04-29 DIAGNOSIS — I10 Essential (primary) hypertension: Secondary | ICD-10-CM

## 2015-04-29 DIAGNOSIS — K760 Fatty (change of) liver, not elsewhere classified: Secondary | ICD-10-CM | POA: Diagnosis not present

## 2015-04-29 DIAGNOSIS — E785 Hyperlipidemia, unspecified: Secondary | ICD-10-CM | POA: Diagnosis not present

## 2015-04-29 DIAGNOSIS — Z113 Encounter for screening for infections with a predominantly sexual mode of transmission: Secondary | ICD-10-CM

## 2015-04-29 LAB — COMPREHENSIVE METABOLIC PANEL
ALBUMIN: 4.3 g/dL (ref 3.5–5.2)
ALK PHOS: 83 U/L (ref 39–117)
ALT: 26 U/L (ref 0–35)
AST: 30 U/L (ref 0–37)
BUN: 26 mg/dL — AB (ref 6–23)
CHLORIDE: 108 meq/L (ref 96–112)
CO2: 26 mEq/L (ref 19–32)
Calcium: 9.8 mg/dL (ref 8.4–10.5)
Creatinine, Ser: 1 mg/dL (ref 0.40–1.20)
GFR: 55.54 mL/min — AB (ref >60.00)
GLUCOSE: 145 mg/dL — AB (ref 70–99)
POTASSIUM: 5.1 meq/L (ref 3.5–5.1)
SODIUM: 140 meq/L (ref 135–145)
TOTAL PROTEIN: 6.8 g/dL (ref 6.0–8.3)
Total Bilirubin: 0.6 mg/dL (ref 0.2–1.2)

## 2015-04-29 LAB — MICROALBUMIN / CREATININE URINE RATIO
Creatinine,U: 278.8 mg/dL
MICROALB UR: 6.8 mg/dL — AB (ref 0.0–1.9)
MICROALB/CREAT RATIO: 2.4 mg/g (ref 0.0–30.0)

## 2015-04-29 LAB — LDL CHOLESTEROL, DIRECT: LDL DIRECT: 116 mg/dL

## 2015-04-29 LAB — TSH: TSH: 2.66 u[IU]/mL (ref 0.35–4.50)

## 2015-04-29 LAB — LIPID PANEL
Cholesterol: 176 mg/dL (ref 0–200)
HDL: 33.8 mg/dL — ABNORMAL LOW (ref >39.00)
LDL Cholesterol: 102 mg/dL — ABNORMAL HIGH (ref 0–99)
NONHDL: 141.87
Total CHOL/HDL Ratio: 5
Triglycerides: 197 mg/dL — ABNORMAL HIGH (ref 0.0–149.0)
VLDL: 39.4 mg/dL (ref 0.0–40.0)

## 2015-04-29 LAB — HEMOGLOBIN A1C: HEMOGLOBIN A1C: 7.1 % — AB (ref 4.6–6.5)

## 2015-04-29 LAB — VITAMIN D 25 HYDROXY (VIT D DEFICIENCY, FRACTURES): VITD: 30.54 ng/mL (ref 30.00–100.00)

## 2015-04-29 NOTE — Progress Notes (Signed)
Subjective:  Patient ID: Pamela Hanna, female    DOB: 06/01/1925  Age: 81 y.o. MRN: [id number]  CC: The primary encounter diagnosis was Diabetes mellitus without complication. Diagnoses of Hyperlipidemia, Hypothyroidism due to acquired atrophy of thyroid, Fatty liver disease, nonalcoholic, Vitamin D deficiency, Screen for STD (sexually transmitted disease), Encounter for immunization, Other chest pain, Gastroesophageal reflux disease with esophagitis, Essential hypertension, and Type 2 diabetes mellitus with diabetic nephropathy were also pertinent to this visit.  HPI Amauria Lelan Pons Treto presents for hospital follow up .She was admitted to [facility] on Sept 18th with chest pain  ruled out for AMI with serial enzymes followed by a Lexiscan which was low probability for ischemia.  She was discharged on Sept 19th .   Imaging studies,  Lexiscan  Reviewed online and with patient today. .  Patient states she was given a [place] , she did not have chest pain during the study. .  Her EF was55 to 79 by the  nuc med study   She feels much better and believes that the previous episode of chest pain was heartburn in retrospect. .  Occurred at 3:30 am in the morning,  Did not respond to prilosec and 3 baby asa chewed up per EMs directions.   Outpatient Prescriptions Prior to Visit  Medication Sig Dispense Refill  . acetaminophen (TYLENOL) 325 MG tablet Take 500 mg by mouth 2 (two) times daily as needed.     Marland Kitchen amLODipine (NORVASC) 5 MG tablet TAKE ONE (1) TABLET BY MOUTH EVERY DAY 90 tablet 1  . aspirin 81 MG tablet Take 1 tablet (81 mg total) by mouth 2 (two) times a week. 30 tablet 11  . atorvastatin (LIPITOR) 20 MG tablet Take 20 mg by mouth daily.    . fish oil-omega-3 fatty acids 1000 MG capsule Take 1,200 mg by mouth daily.     . furosemide (LASIX) 20 MG tablet Take 1 tablet (20 mg total) by mouth daily. As needed for fluid retention 30 tablet 3  . glimepiride (AMARYL) 2 MG tablet TAKE ONE (1) TABLET BY MOUTH  TWO (2) TIMES DAILY 180 tablet 1  . glucose blood (BAYER CONTOUR NEXT TEST) test strip Use as instructed 100 each 12  . metFORMIN (GLUCOPHAGE) 500 MG tablet TAKE ONE TABLET TWICE A DAY WITH FOOD 180 tablet 1  . naproxen sodium (ANAPROX) 220 MG tablet Take 220 mg by mouth 2 (two) times daily with a meal.    . sitaGLIPtin (JANUVIA) 100 MG tablet Take 1 tablet (100 mg total) by mouth daily. 90 tablet 1  . SYNTHROID 75 MCG tablet TAKE ONE (1) TABLET BY MOUTH EVERY DAY 90 tablet 3  . omeprazole (PRILOSEC) 20 MG capsule TAKE ONE CAPSULE BY MOUTH DAILY 90 capsule 1  . Calcium-Magnesium-Zinc 333-133-5 MG TABS Take 1 tablet by mouth daily.     No facility-administered medications prior to visit.    Review of Systems;  Patient denies headache, fevers, malaise, unintentional weight loss, skin rash, eye pain, sinus congestion and sinus pain, sore throat, dysphagia,  hemoptysis , cough, dyspnea, wheezing, chest pain, palpitations, orthopnea, edema, abdominal pain, nausea, melena, diarrhea, constipation, flank pain, dysuria, hematuria, urinary  Frequency, nocturia, numbness, tingling, seizures,  Focal weakness, Loss of consciousness,  Tremor, insomnia, depression, anxiety, and suicidal ideation.      Objective:  BP 146/60 mmHg  Pulse 67  Temp(Src) 98.7 F (37.1 C) (Oral)  Resp 12  Ht 5\' 4"  (1.626 m)  Wt 149 lb 8  oz (67.813 kg)  BMI 25.65 kg/m2  SpO2 98%  BP Readings from Last 3 Encounters:  04/29/15 146/60  04/26/15 152/50  01/27/15 136/60    Wt Readings from Last 3 Encounters:  04/29/15 149 lb 8 oz (67.813 kg)  04/25/15 147 lb (66.679 kg)  01/27/15 150 lb 8 oz (68.266 kg)    General appearance: alert, cooperative and appears stated age Ears: normal TM's and external ear canals both ears Throat: lips, mucosa, and tongue normal; teeth and gums normal Neck: no adenopathy, no carotid bruit, supple, symmetrical, trachea midline and thyroid not enlarged, symmetric, no  tenderness/mass/nodules Back: symmetric, no curvature. ROM normal. No CVA tenderness. Lungs: clear to auscultation bilaterally Heart: regular rate and rhythm, S1, S2 normal, no murmur, click, rub or gallop Abdomen: soft, non-tender; bowel sounds normal; no masses,  no organomegaly Pulses: 2+ and symmetric Skin: Skin color, texture, turgor normal. No rashes or lesions Lymph nodes: Cervical, supraclavicular, and axillary nodes normal.  Lab Results  Component Value Date   HGBA1C 7.1* 04/29/2015   HGBA1C 6.9* 01/18/2015   HGBA1C 7.0* 07/22/2014    Lab Results  Component Value Date   CREATININE 1.00 04/29/2015   CREATININE 0.91 04/26/2015   CREATININE 0.88 04/25/2015    Lab Results  Component Value Date   WBC 4.3 04/26/2015   HGB 12.0 04/26/2015   HCT 35.2 04/26/2015   PLT 182 04/26/2015   GLUCOSE 145* 04/29/2015   CHOL 176 04/29/2015   TRIG 197.0* 04/29/2015   HDL 33.80* 04/29/2015   LDLDIRECT 116.0 04/29/2015   LDLCALC 102* 04/29/2015   ALT 26 04/29/2015   AST 30 04/29/2015   NA 140 04/29/2015   K 5.1 04/29/2015   CL 108 04/29/2015   CREATININE 1.00 04/29/2015   BUN 26* 04/29/2015   CO2 26 04/29/2015   TSH 2.66 04/29/2015   HGBA1C 7.1* 04/29/2015   MICROALBUR 6.8* 04/29/2015    Dg Chest 2 View  04/25/2015   CLINICAL DATA:  Anterior upper chest pain since 3 a.m. today, personal history of breast cancer  EXAM: CHEST  2 VIEW  COMPARISON:  None.  FINDINGS: Heart size and vascular pattern are normal. Lungs are clear. Postsurgical change on the left consistent with mastectomy and axillary node dissection. Multiple nonacute appearing posterior left rib fractures. No pleural effusion. Aortic calcification.  IMPRESSION: No acute findings.  Atherosclerotic aortic calcification.   Electronically Signed   By: Skipper Cliche M.D.   On: 04/25/2015 07:19   Nm Myocar Multi W/spect W/wall Motion / Ef  04/26/2015    T wave inversion was noted during stress in the V2 and V3 leads.   There was no ST segment deviation noted during stress.  The study is normal.  This is a low risk study.  The left ventricular ejection fraction is normal (55-65%).     Assessment & Plan:   Problem List Items Addressed This Visit      Unprioritized   Hypertension    nOT Well controlled on current regimen. Renal function stable, WILL INCREASE AMLODIPINE to 10 mg daily   Lab Results  Component Value Date   CREATININE 1.00 04/29/2015   Lab Results  Component Value Date   NA 140 04/29/2015   K 5.1 04/29/2015   CL 108 04/29/2015   CO2 26 04/29/2015           Diabetes mellitus with nephropathy - Primary    Continued  control with low GI diet, medications and wt loss/exercise .  Patient is up to date on annual   eye exam and foot exam was done today .  There is mild proteinuria on micro urinalysis . She is tolerating atorvastatin and diovan .   Tolerating januvia 100 mg .  Lab Results  Component Value Date   HGBA1C 7.1* 04/29/2015   Lab Results  Component Value Date   MICROALBUR 6.8* 04/29/2015     Lab Results  Component Value Date   CHOL 176 04/29/2015   HDL 33.80* 04/29/2015   LDLCALC 102* 04/29/2015   LDLDIRECT 116.0 04/29/2015   TRIG 197.0* 04/29/2015   CHOLHDL 5 04/29/2015               GERD (gastroesophageal reflux disease)    continue omeprazole, adding a second dose in early evening to prevent nocturnal symptoms       Chest pain    Atypical for angina, with normal noninvasive workup for same. Will treat for GERD       Hyperlipidemia   Hypothyroidism   Relevant Orders   TSH (Completed)   Fatty liver disease, nonalcoholic    Other Visit Diagnoses    Vitamin D deficiency        Relevant Orders    Vit D  25 hydroxy (rtn osteoporosis monitoring) (Completed)    Screen for STD (sexually transmitted disease)        Relevant Orders    HIV antibody    Hepatitis C antibody    Encounter for immunization           I am having Ms. Caputo maintain  her fish oil-omega-3 fatty acids, glucose blood, furosemide, sitaGLIPtin, SYNTHROID, glimepiride, metFORMIN, amLODipine, naproxen sodium, aspirin, Calcium-Magnesium-Zinc, atorvastatin, and acetaminophen.  No orders of the defined types were placed in this encounter.    There are no discontinued medications.  Follow-up: Return in about 3 months (around 07/29/2015) for follow up diabetes.   Crecencio Mc, MD

## 2015-04-29 NOTE — Progress Notes (Signed)
Pre-visit discussion using our clinic review tool. No additional management support is needed unless otherwise documented below in the visit note.  

## 2015-04-29 NOTE — Patient Instructions (Addendum)
Your stress test was normal and your heart function is normal.    I think your chest pain was due to either reflux or gas!  You can increase the prilosec  To twice daily  AND:   You should buy Gaviscon or Mylanta Gas and keep it on hand for your next episode .    Taking  "Beano" before any meal containing vegetables may prevent the gas

## 2015-04-30 ENCOUNTER — Other Ambulatory Visit: Payer: Self-pay | Admitting: Internal Medicine

## 2015-05-01 ENCOUNTER — Encounter: Payer: Self-pay | Admitting: Internal Medicine

## 2015-05-01 NOTE — Assessment & Plan Note (Signed)
continue omeprazole, adding a second dose in early evening to prevent nocturnal symptoms

## 2015-05-01 NOTE — Assessment & Plan Note (Addendum)
nOT Well controlled on current regimen. Renal function stable, WILL INCREASE AMLODIPINE to 10 mg daily   Lab Results  Component Value Date   CREATININE 1.00 04/29/2015   Lab Results  Component Value Date   NA 140 04/29/2015   K 5.1 04/29/2015   CL 108 04/29/2015   CO2 26 04/29/2015

## 2015-05-01 NOTE — Assessment & Plan Note (Signed)
Continued  control with low GI diet, medications and wt loss/exercise .  Patient is up to date on annual   eye exam and foot exam was done today .  There is mild proteinuria on micro urinalysis . She is tolerating atorvastatin and diovan .   Tolerating januvia 100 mg .  Lab Results  Component Value Date   HGBA1C 7.1* 04/29/2015   Lab Results  Component Value Date   MICROALBUR 6.8* 04/29/2015     Lab Results  Component Value Date   CHOL 176 04/29/2015   HDL 33.80* 04/29/2015   LDLCALC 102* 04/29/2015   LDLDIRECT 116.0 04/29/2015   TRIG 197.0* 04/29/2015   CHOLHDL 5 04/29/2015

## 2015-05-01 NOTE — Assessment & Plan Note (Signed)
Atypical for angina, with normal noninvasive workup for same. Will treat for GERD

## 2015-05-02 ENCOUNTER — Encounter: Payer: Self-pay | Admitting: Internal Medicine

## 2015-05-03 ENCOUNTER — Other Ambulatory Visit: Payer: Self-pay | Admitting: Internal Medicine

## 2015-05-03 MED ORDER — OMEPRAZOLE 20 MG PO CPDR: 20.0000 mg | DELAYED_RELEASE_CAPSULE | Freq: Two times a day (BID) | ORAL | Status: AC

## 2015-05-08 ENCOUNTER — Other Ambulatory Visit: Payer: Self-pay | Admitting: Internal Medicine

## 2015-05-29 ENCOUNTER — Other Ambulatory Visit: Payer: Self-pay | Admitting: Internal Medicine

## 2015-06-19 ENCOUNTER — Other Ambulatory Visit: Payer: Self-pay | Admitting: Internal Medicine

## 2015-07-19 ENCOUNTER — Other Ambulatory Visit: Payer: Self-pay | Admitting: Internal Medicine

## 2015-07-19 DIAGNOSIS — Z1231 Encounter for screening mammogram for malignant neoplasm of breast: Secondary | ICD-10-CM

## 2015-07-21 ENCOUNTER — Other Ambulatory Visit: Payer: Self-pay | Admitting: Internal Medicine

## 2015-07-21 ENCOUNTER — Ambulatory Visit
Admission: RE | Admit: 2015-07-21 | Discharge: 2015-07-21 | Disposition: A | Discharge status: Home / Self Care | Payer: Medicare Other | Source: Ambulatory Visit | Attending: Internal Medicine | Admitting: Internal Medicine

## 2015-07-21 ENCOUNTER — Encounter: Payer: Self-pay | Admitting: Internal Medicine

## 2015-07-21 DIAGNOSIS — Z1231 Encounter for screening mammogram for malignant neoplasm of breast: Secondary | ICD-10-CM

## 2015-08-02 ENCOUNTER — Other Ambulatory Visit: Payer: Self-pay | Admitting: Internal Medicine

## 2015-08-09 ENCOUNTER — Other Ambulatory Visit: Payer: Self-pay | Admitting: Internal Medicine

## 2015-08-10 ENCOUNTER — Other Ambulatory Visit (INDEPENDENT_AMBULATORY_CARE_PROVIDER_SITE_OTHER): Payer: Medicare Other

## 2015-08-10 ENCOUNTER — Telehealth: Payer: Self-pay | Admitting: *Deleted

## 2015-08-10 DIAGNOSIS — E119 Type 2 diabetes mellitus without complications: Secondary | ICD-10-CM

## 2015-08-10 DIAGNOSIS — R748 Abnormal levels of other serum enzymes: Secondary | ICD-10-CM

## 2015-08-10 DIAGNOSIS — Z1159 Encounter for screening for other viral diseases: Secondary | ICD-10-CM

## 2015-08-10 LAB — COMPREHENSIVE METABOLIC PANEL WITH GFR
ALT: 31 U/L (ref 0–35)
AST: 38 U/L — ABNORMAL HIGH (ref 0–37)
Albumin: 4.5 g/dL (ref 3.5–5.2)
Alkaline Phosphatase: 142 U/L — ABNORMAL HIGH (ref 39–117)
BUN: 21 mg/dL (ref 6–23)
CO2: 26 meq/L (ref 19–32)
Calcium: 10 mg/dL (ref 8.4–10.5)
Chloride: 108 meq/L (ref 96–112)
Creatinine, Ser: 0.89 mg/dL (ref 0.40–1.20)
GFR: 63.49 mL/min
Glucose, Bld: 112 mg/dL — ABNORMAL HIGH (ref 70–99)
Potassium: 5.1 meq/L (ref 3.5–5.1)
Sodium: 141 meq/L (ref 135–145)
Total Bilirubin: 0.6 mg/dL (ref 0.2–1.2)
Total Protein: 6.9 g/dL (ref 6.0–8.3)

## 2015-08-10 LAB — HEMOGLOBIN A1C: Hgb A1c MFr Bld: 6.9 % — ABNORMAL HIGH (ref 4.6–6.5)

## 2015-08-10 NOTE — Telephone Encounter (Signed)
Labs and dx?  

## 2015-08-10 NOTE — Telephone Encounter (Signed)
Labs ordeed. No fasting required

## 2015-08-12 ENCOUNTER — Encounter: Payer: Self-pay | Admitting: Internal Medicine

## 2015-08-12 NOTE — Addendum Note (Signed)
Addended by: Crecencio Mc on: 08/12/2015 05:13 PM   Modules accepted: Orders

## 2015-08-13 ENCOUNTER — Ambulatory Visit: Payer: Medicare Other | Admitting: Internal Medicine

## 2015-08-17 ENCOUNTER — Other Ambulatory Visit: Payer: Self-pay | Admitting: Internal Medicine

## 2015-08-19 ENCOUNTER — Telehealth: Payer: Self-pay | Admitting: Internal Medicine

## 2015-08-19 ENCOUNTER — Ambulatory Visit
Admission: RE | Admit: 2015-08-19 | Discharge: 2015-08-19 | Disposition: A | Discharge status: Home / Self Care | Payer: Medicare Other | Source: Ambulatory Visit | Attending: Internal Medicine | Admitting: Internal Medicine

## 2015-08-19 DIAGNOSIS — R160 Hepatomegaly, not elsewhere classified: Secondary | ICD-10-CM | POA: Diagnosis not present

## 2015-08-19 DIAGNOSIS — K829 Disease of gallbladder, unspecified: Secondary | ICD-10-CM | POA: Diagnosis not present

## 2015-08-19 DIAGNOSIS — R748 Abnormal levels of other serum enzymes: Secondary | ICD-10-CM | POA: Diagnosis present

## 2015-08-19 NOTE — Telephone Encounter (Signed)
Linus Orn  [phone] called from [facility] partners with ultrasound results of the abdomen. Thank You!

## 2015-08-19 NOTE — Telephone Encounter (Signed)
Scheduled for appt for Monday to review.

## 2015-08-19 NOTE — Telephone Encounter (Signed)
Spoke with Linus Orn at [facility].  Results of [place] in EPIC for you to see.  Please advise?

## 2015-08-19 NOTE — Telephone Encounter (Signed)
Please make an appointment on Monday for patient to reviewe the results of her ULTRASOUND WITH ME .  Will need 30 minutes.  (Bad news,  I don not want to tell her over the phone)

## 2015-08-23 ENCOUNTER — Encounter: Payer: Self-pay | Admitting: Internal Medicine

## 2015-08-23 ENCOUNTER — Ambulatory Visit (INDEPENDENT_AMBULATORY_CARE_PROVIDER_SITE_OTHER): Payer: Medicare Other | Admitting: Internal Medicine

## 2015-08-23 VITALS — BP 148/68 | HR 74 | Temp 98.1°F | Resp 12 | Ht 64.0 in | Wt 151.8 lb

## 2015-08-23 DIAGNOSIS — K828 Other specified diseases of gallbladder: Secondary | ICD-10-CM | POA: Diagnosis not present

## 2015-08-23 DIAGNOSIS — R932 Abnormal findings on diagnostic imaging of liver and biliary tract: Secondary | ICD-10-CM | POA: Diagnosis not present

## 2015-08-23 NOTE — Progress Notes (Signed)
Pre-visit discussion using our clinic review tool. No additional management support is needed unless otherwise documented below in the visit note.  

## 2015-08-23 NOTE — Progress Notes (Signed)
Subjective:  Patient ID: Pamela Hanna, female    DOB: 22-Jan-1925  Age: 82 y.o. MRN: [id number]  CC: The primary encounter diagnosis was Gallbladder mass. A diagnosis of Abnormal liver diagnostic imaging was also pertinent to this visit.  HPI Pamela Hanna presents for follow up on abnormal liver ultrasound and elevated liver enzymes.  Patient has a remote history of breast cancer, treated with mastectomy in the 1970's, (last mammogram normal Dec 2016)  and controlled Type 2 DM with fatty liver, who underwent an abdominal ultrasound last week due to new onset elevated alk phos and AST.  The report included a 2.5 cm mass in the GB and innumerable hypoechoic foci in the liver concerning for metastatic cancer.  Her previous liver ultrasound was Sept 2013 u/s at which time only fatty liver was suggested.  Patient feels fine,  Has no history of jaundice,  Weight loss or abdominal pain.   She has a history of laminoplasty of T3 and T4 with removal of psammomatous  Meningioma Grade 1  done at [facility] in 2006/   Outpatient Prescriptions Prior to Visit  Medication Sig Dispense Refill  . acetaminophen (TYLENOL) 325 MG tablet Take 500 mg by mouth 2 (two) times daily as needed.     Marland Kitchen amLODipine (NORVASC) 5 MG tablet TAKE ONE (1) TABLET EACH DAY 90 tablet 2  . aspirin 81 MG tablet Take 1 tablet (81 mg total) by mouth 2 (two) times a week. 30 tablet 11  . atorvastatin (LIPITOR) 20 MG tablet Take 20 mg by mouth daily.    . Calcium-Magnesium-Zinc 333-133-5 MG TABS Take 1 tablet by mouth daily.    . fish oil-omega-3 fatty acids 1000 MG capsule Take 1,200 mg by mouth daily.     . furosemide (LASIX) 20 MG tablet Take 1 tablet (20 mg total) by mouth daily. As needed for fluid retention 30 tablet 3  . glimepiride (AMARYL) 2 MG tablet TAKE ONE (1) TABLET BY MOUTH TWO (2) TIMES DAILY 180 tablet 1  . glucose blood (BAYER CONTOUR NEXT TEST) test strip Use as instructed 100 each 12  . JANUVIA 100 MG tablet TAKE ONE (1)  TABLET EACH DAY 90 tablet 1  . metFORMIN (GLUCOPHAGE) 500 MG tablet TAKE ONE TABLET BY MOUTH TWICE DAILY WITH FOOD AS DIRECTED 180 tablet 0  . naproxen sodium (ANAPROX) 220 MG tablet Take 220 mg by mouth 2 (two) times daily with a meal.    . omeprazole (PRILOSEC) 20 MG capsule Take 1 capsule (20 mg total) by mouth 2 (two) times daily before a meal. 90 capsule 1  . SYNTHROID 75 MCG tablet TAKE ONE (1) TABLET BY MOUTH EVERY DAY 90 tablet 3  . atorvastatin (LIPITOR) 40 MG tablet TAKE ONE (1) TABLET BY MOUTH EVERY DAY (Patient not taking: Reported on 08/23/2015) 90 tablet 1   No facility-administered medications prior to visit.    Review of Systems;  Patient denies headache, fevers, malaise, unintentional weight loss, skin rash, eye pain, sinus congestion and sinus pain, sore throat, dysphagia,  hemoptysis , cough, dyspnea, wheezing, chest pain, palpitations, orthopnea, edema, abdominal pain, nausea, melena, diarrhea, constipation, flank pain, dysuria, hematuria, urinary  Frequency, nocturia, numbness, tingling, seizures,  Focal weakness, Loss of consciousness,  Tremor, insomnia, depression, anxiety, and suicidal ideation.      Objective:  BP 148/68 mmHg  Pulse 74  Temp(Src) 98.1 F (36.7 C) (Oral)  Resp 12  Ht _0  (1.626 m)  Wt 151 lb 12 oz (  68.833 kg)  BMI 26.03 kg/m2  SpO2 97%  BP Readings from Last 3 Encounters:  08/23/15 148/68  04/29/15 146/60  04/26/15 152/50    Wt Readings from Last 3 Encounters:  08/23/15 151 lb 12 oz (68.833 kg)  04/29/15 149 lb 8 oz (67.813 kg)  04/25/15 147 lb (66.679 kg)    General appearance: alert, cooperative and appears stated age Neck: no adenopathy, no carotid bruit, supple, symmetrical, trachea midline and thyroid not enlarged, symmetric, no tenderness/mass/nodules Back: symmetric, no curvature. ROM normal. No CVA tenderness. Lungs: clear to auscultation bilaterally Heart: regular rate and rhythm, S1, S2 normal, no murmur, click, rub or  gallop Abdomen: soft, non-tender; bowel sounds normal; no masses,  no organomegaly Pulses: 2+ and symmetric Skin: Skin color, texture, turgor normal. No rashes or lesions Lymph nodes: Cervical, supraclavicular, and axillary nodes normal.  Lab Results  Component Value Date   HGBA1C 6.9* 08/10/2015   HGBA1C 7.1* 04/29/2015   HGBA1C 6.9* 01/18/2015    Lab Results  Component Value Date   CREATININE 0.89 08/10/2015   CREATININE 1.00 04/29/2015   CREATININE 0.91 04/26/2015    Lab Results  Component Value Date   WBC 4.3 04/26/2015   HGB 12.0 04/26/2015   HCT 35.2 04/26/2015   PLT 182 04/26/2015   GLUCOSE 112* 08/10/2015   CHOL 176 04/29/2015   TRIG 197.0* 04/29/2015   HDL 33.80* 04/29/2015   LDLDIRECT 116.0 04/29/2015   LDLCALC 102* 04/29/2015   ALT 31 08/10/2015   AST 38* 08/10/2015   NA 141 08/10/2015   K 5.1 08/10/2015   CL 108 08/10/2015   CREATININE 0.89 08/10/2015   BUN 21 08/10/2015   CO2 26 08/10/2015   TSH 2.66 04/29/2015   HGBA1C 6.9* 08/10/2015   MICROALBUR 6.8* 04/29/2015    US Abdomen Complete  08/19/2015  CLINICAL DATA:  Elevated liver enzymes.  History breast carcinoma EXAM: ABDOMEN ULTRASOUND COMPLETE COMPARISON:  Abdominal ultrasound May 02, 2012 FINDINGS: Gallbladder: Within the gallbladder, there is a nonmobile echogenic focus along the fundus of the gallbladder measuring 3.2 x 2.4 cm. There are no echogenic foci in the gallbladder which move and shadow as would be expected with gallstones. There is no gallbladder wall thickening or pericholecystic fluid. No sonographic Murphy sign noted by sonographer. Common bile duct: Diameter: 4 mm. There is no intrahepatic, common hepatic, common bile duct dilatation. Liver: There are innumerable hypoechoic masses throughout the liver measuring as small as 2 mm to as large as 1.5 x 1.5 cm. The overall echogenicity of the liver is within normal limits. IVC: No abnormality visualized. Pancreas: Visualized portion  unremarkable. Portions of pancreas obscured by gas. Spleen: Size and appearance within normal limits. Right Kidney: Length: 10.2 cm. Echogenicity within normal limits. No mass or hydronephrosis visualized. Left Kidney: Length: 9.7 cm. Echogenicity within normal limits. No mass or hydronephrosis visualized. Abdominal aorta: No aneurysm visualized. Other findings: No demonstrable ascites. IMPRESSION: Nonmobile lesion in the fundus of the gallbladder consistent with either inspissated sludge or mass. A mass arising from the gallbladder wall cannot be excluded on this study. Innumerable small masses in the liver, felt to represent metastatic foci. Portions of pancreas obscured by gas. Visualized portions of pancreas appear normal. Study otherwise unremarkable. These results will be called to the ordering clinician or representative by the Radiologist Assistant, and communication documented in the PACS or zVision Dashboard. Electronically Signed   By: Lowella Grip III M.D.   On: 08/19/2015 09:13    Assessment &  Plan:   Problem List Items Addressed This Visit    Gallbladder mass - Primary    Asymptomatic ,  Found during abdominal ultrasound.  Discussed with patient and daughter today,  Patient to decide whether to pursue workup and if so,  To e done at [facility] per patient request.       Abnormal liver diagnostic imaging    Recent radiology report suggests metastatic disease of unknown primary.  History of breast Ca treated in 1970s, with left mastectomy, no recurrence by serial mammograms (Dec 2016) , and new finding of GB mass.  Workup to be done at [facility] if patient decides to pursue.           A total of 40 minutes was spent with patient more than half of which was spent in counseling patient on the above mentioned issues , reviewing and explaining recent labs and imaging studies done, and coordination of care.  I am having Ms. Bonnes maintain her fish oil-omega-3 fatty acids, glucose blood,  furosemide, naproxen sodium, aspirin, Calcium-Magnesium-Zinc, atorvastatin, acetaminophen, omeprazole, glimepiride, amLODipine, metFORMIN, JANUVIA, atorvastatin, and SYNTHROID.  No orders of the defined types were placed in this encounter.    There are no discontinued medications.  Follow-up: No Follow-up on file.   Crecencio Mc, MD

## 2015-08-23 NOTE — Patient Instructions (Signed)
Your ultrasound suggests thasomething is affectin gyour liver other than the fatty liver,  There is a 3 cm mass in the gallbladder that my be just a polyp,  But we are not sure  And there are lots of different sized nodules in the liver that have the pattern of a metastatic cancer   This cannot be confirmed or disproven without a biopsy, which would be the next steop

## 2015-08-24 ENCOUNTER — Encounter: Payer: Self-pay | Admitting: Internal Medicine

## 2015-08-24 DIAGNOSIS — R932 Abnormal findings on diagnostic imaging of liver and biliary tract: Secondary | ICD-10-CM | POA: Insufficient documentation

## 2015-08-24 DIAGNOSIS — K828 Other specified diseases of gallbladder: Secondary | ICD-10-CM | POA: Insufficient documentation

## 2015-08-24 NOTE — Assessment & Plan Note (Signed)
Recent radiology report suggests metastatic disease of unknown primary.  History of breast Ca treated in 1970s, with left mastectomy, no recurrence by serial mammograms (Dec 2016) , and new finding of GB mass.  Workup to be done at [facility] if patient decides to pursue.

## 2015-08-24 NOTE — Assessment & Plan Note (Signed)
Asymptomatic ,  Found during abdominal ultrasound.  Discussed with patient and daughter today,  Patient to decide whether to pursue workup and if so,  To e done at [facility] per patient request.

## 2015-08-25 ENCOUNTER — Ambulatory Visit: Payer: Self-pay | Admitting: Internal Medicine

## 2015-09-18 ENCOUNTER — Other Ambulatory Visit: Payer: Self-pay | Admitting: Internal Medicine

## 2015-11-15 ENCOUNTER — Other Ambulatory Visit: Payer: Self-pay | Admitting: Internal Medicine

## 2015-11-26 ENCOUNTER — Other Ambulatory Visit: Payer: Self-pay | Admitting: Internal Medicine

## 2016-01-06 DEATH — deceased

## 2016-03-01 IMAGING — US US ABDOMEN COMPLETE
1 series · 13 of 25 positions shown · non-contrast
Comparison: Abdominal ultrasound May 02, 2012

CLINICAL DATA: Elevated liver enzymes.  History breast carcinoma

EXAM:
ABDOMEN ULTRASOUND COMPLETE

[Series 1: us abdomen complete · 0.20mm/px · 13 of 117 slices shown]
[im 1/117]
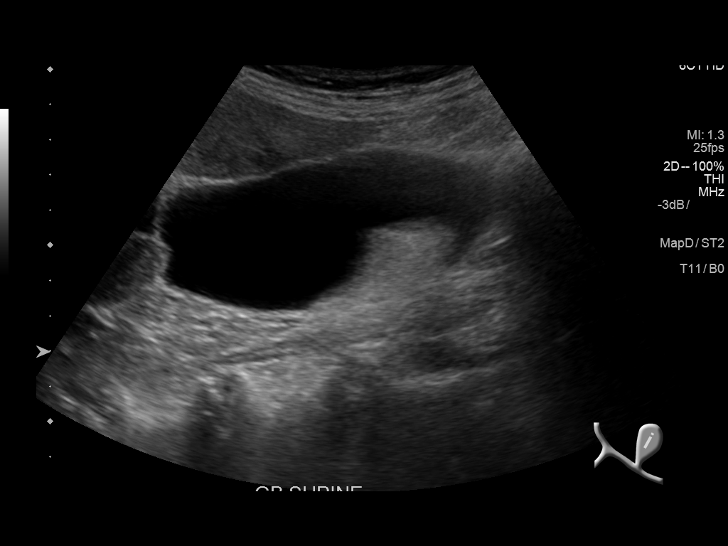
[im 10/117]
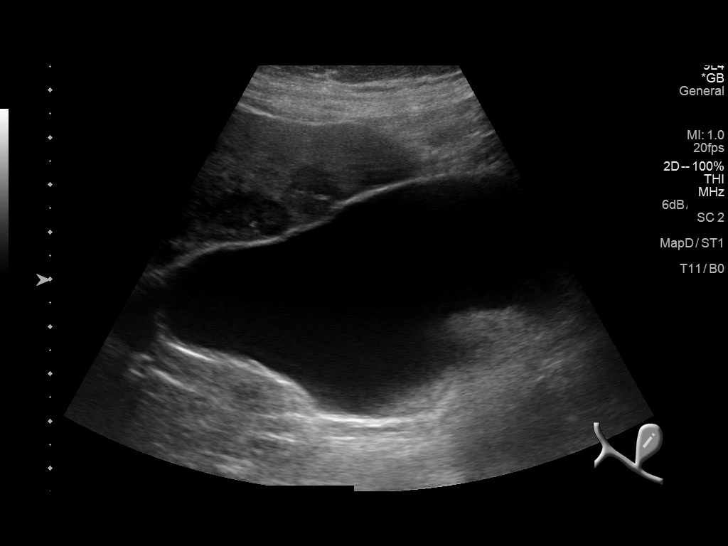
[im 20/117]
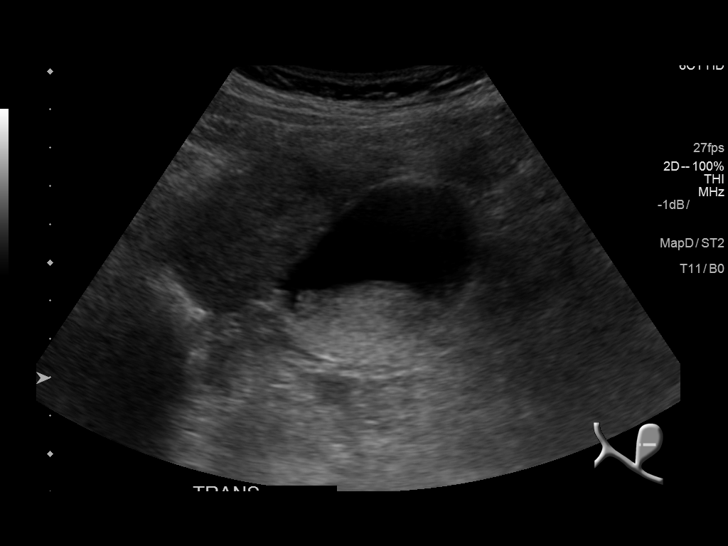
[im 30/117]
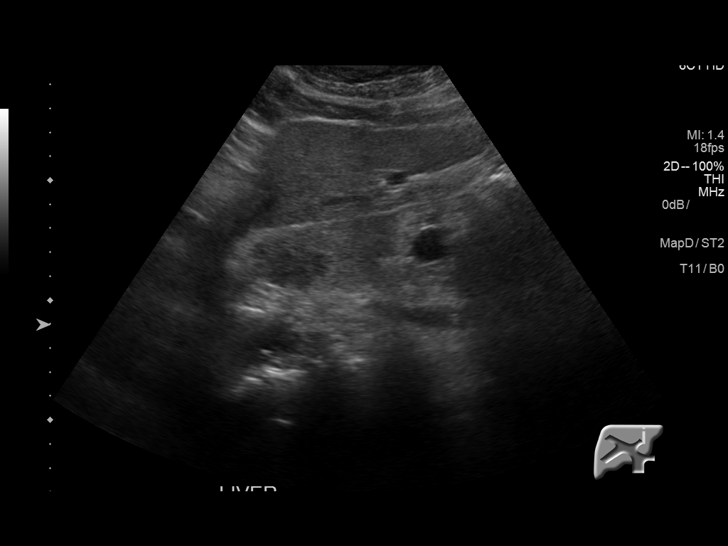
[im 39/117]
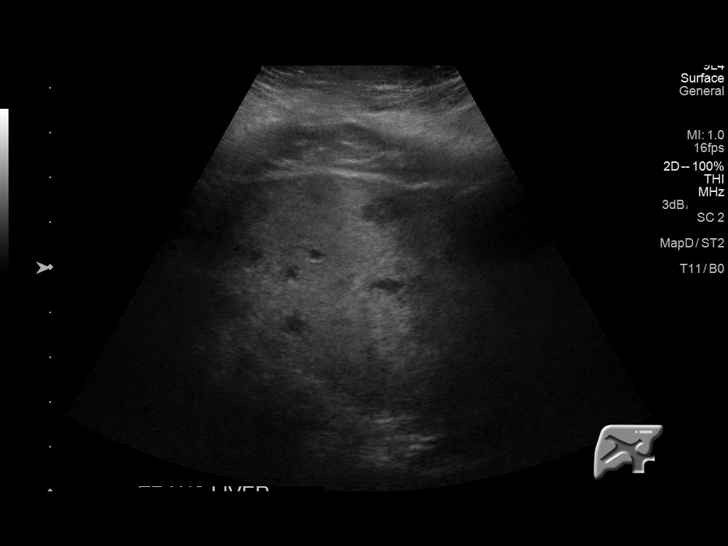
[im 49/117]
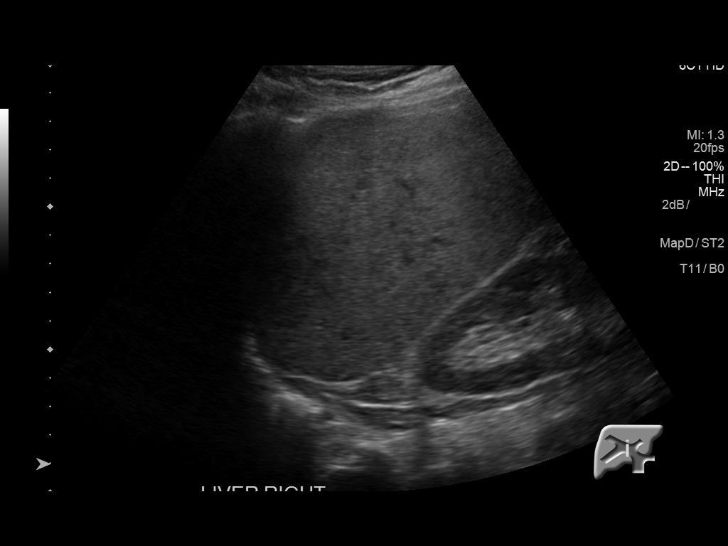
[im 59/117]
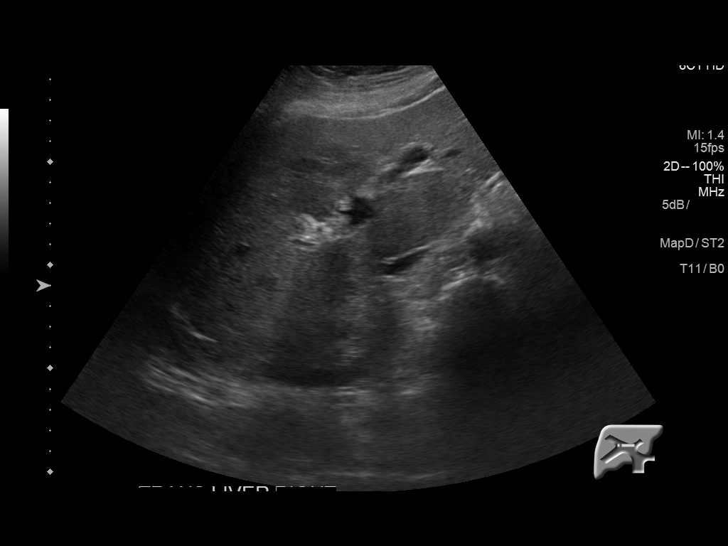
[im 68/117]
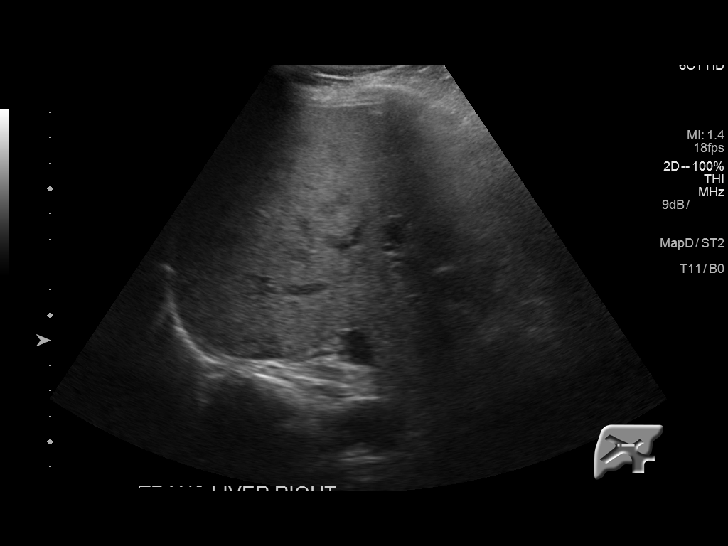
[im 78/117]
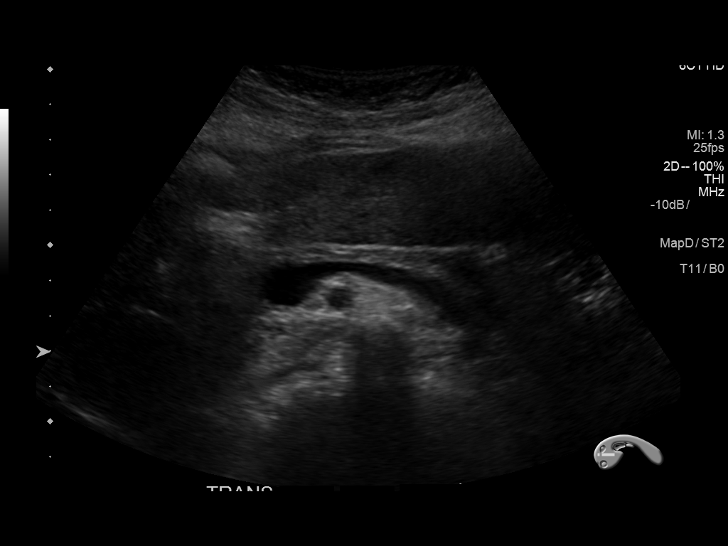
[im 88/117]
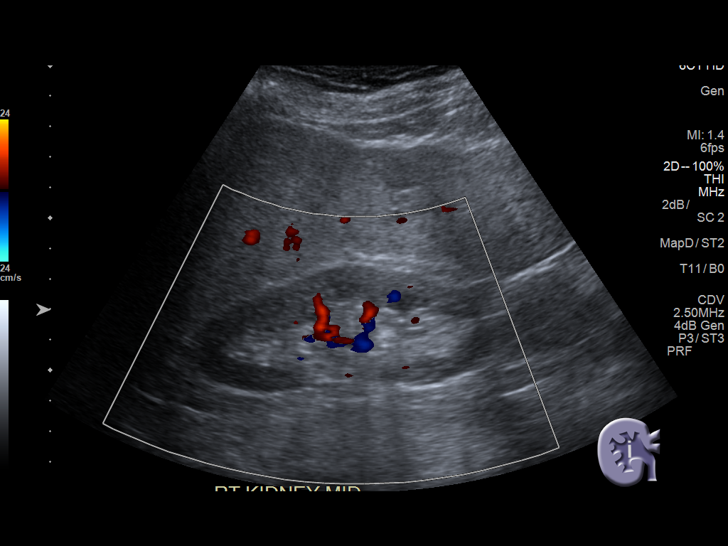
[im 97/117]
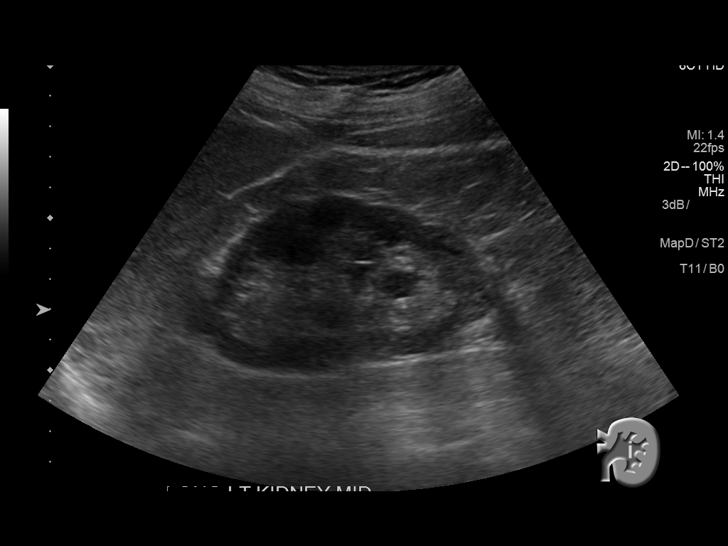
[im 107/117]
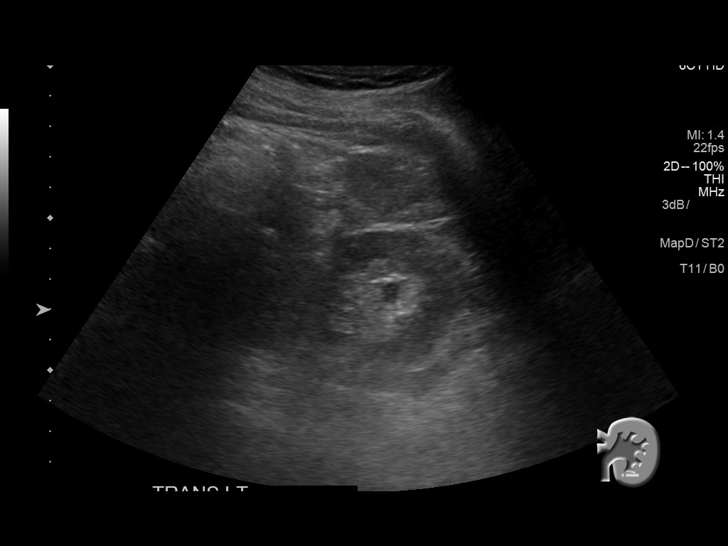
[im 117/117]
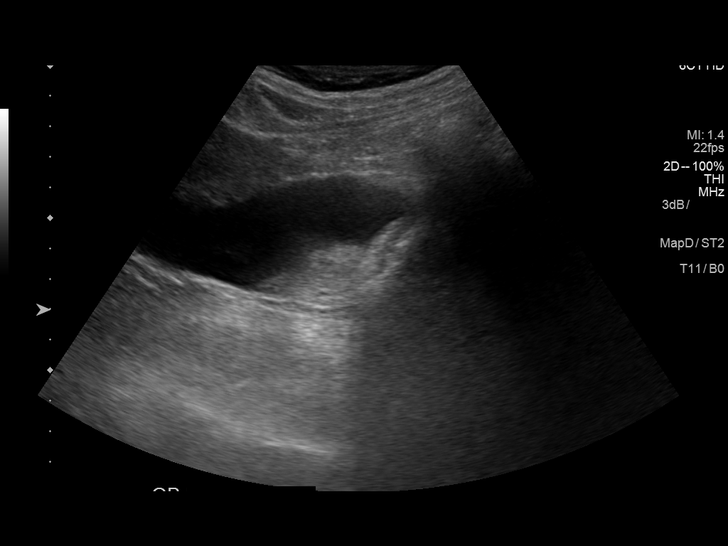

[13 of 25 positions shown; findings below may reference images not displayed]

FINDINGS: Gallbladder: Within the gallbladder, there is a nonmobile echogenic
focus along the fundus of the gallbladder measuring 3.2 x 2.4 cm.
There are no echogenic foci in the gallbladder which move and shadow
as would be expected with gallstones. There is no gallbladder wall
thickening or pericholecystic fluid. No sonographic Murphy sign
noted by sonographer.

Common bile duct: Diameter: 4 mm. There is no intrahepatic, common
hepatic, common bile duct dilatation.

Liver: There are innumerable hypoechoic masses throughout the liver
measuring as small as 2 mm to as large as 1.5 x 1.5 cm. The overall
echogenicity of the liver is within normal limits.

IVC: No abnormality visualized.

Pancreas: Visualized portion unremarkable. Portions of pancreas
obscured by gas.

Spleen: Size and appearance within normal limits.

Right Kidney: Length: 10.2 cm. Echogenicity within normal limits. No
mass or hydronephrosis visualized.

Left Kidney: Length: 9.7 cm. Echogenicity within normal limits. No
mass or hydronephrosis visualized.

Abdominal aorta: No aneurysm visualized.

Other findings: No demonstrable ascites.
IMPRESSION: Nonmobile lesion in the fundus of the gallbladder consistent with
either inspissated sludge or mass. A mass arising from the
gallbladder wall cannot be excluded on this study.

Innumerable small masses in the liver, felt to represent metastatic
foci.

Portions of pancreas obscured by gas. Visualized portions of
pancreas appear normal.

Study otherwise unremarkable.

These results will be called to the ordering clinician or
representative by the Radiologist Assistant, and communication
documented in the PACS or zVision Dashboard.

## 2016-09-13 ENCOUNTER — Encounter: Payer: Self-pay | Admitting: Internal Medicine
